# Patient Record
Sex: Female | Born: 1960 | Race: Black or African American | Hispanic: No | Marital: Married | Smoking: Never smoker
Health system: Southern US, Community
[De-identification: ages and names within clinical notes are randomized; demographics above are authoritative.]

## PROBLEM LIST (undated history)

## (undated) DIAGNOSIS — I10 Essential (primary) hypertension: Secondary | ICD-10-CM

## (undated) DIAGNOSIS — E119 Type 2 diabetes mellitus without complications: Secondary | ICD-10-CM

## (undated) HISTORY — PX: ABDOMINAL HYSTERECTOMY: SHX81

## (undated) HISTORY — PX: KNEE SURGERY: SHX244

## (undated) HISTORY — PX: FINGER SURGERY: SHX640

---

## 1999-03-19 ENCOUNTER — Other Ambulatory Visit: Admission: RE | Admit: 1999-03-19 | Discharge: 1999-03-19 | Payer: Self-pay | Admitting: Otolaryngology

## 2002-04-17 ENCOUNTER — Other Ambulatory Visit: Admission: RE | Admit: 2002-04-17 | Discharge: 2002-04-17 | Payer: Self-pay | Admitting: *Deleted

## 2005-08-17 ENCOUNTER — Ambulatory Visit: Payer: Self-pay | Admitting: General Practice

## 2006-08-31 ENCOUNTER — Ambulatory Visit: Payer: Self-pay | Admitting: General Practice

## 2007-07-01 ENCOUNTER — Emergency Department (HOSPITAL_COMMUNITY): Admission: EM | Admit: 2007-07-01 | Discharge: 2007-07-01 | Payer: Self-pay | Admitting: Emergency Medicine

## 2007-07-26 ENCOUNTER — Other Ambulatory Visit: Admission: RE | Admit: 2007-07-26 | Discharge: 2007-07-26 | Payer: Self-pay | Admitting: Obstetrics and Gynecology

## 2007-09-05 ENCOUNTER — Ambulatory Visit: Payer: Self-pay | Admitting: General Practice

## 2007-10-23 ENCOUNTER — Inpatient Hospital Stay (HOSPITAL_COMMUNITY): Admission: RE | Admit: 2007-10-23 | Discharge: 2007-10-26 | Payer: Self-pay | Admitting: Obstetrics and Gynecology

## 2007-10-23 ENCOUNTER — Encounter: Payer: Self-pay | Admitting: Obstetrics and Gynecology

## 2007-10-28 ENCOUNTER — Inpatient Hospital Stay (HOSPITAL_COMMUNITY): Admission: AD | Admit: 2007-10-28 | Discharge: 2007-10-30 | Payer: Self-pay | Admitting: Obstetrics and Gynecology

## 2008-08-04 ENCOUNTER — Emergency Department (HOSPITAL_COMMUNITY): Admission: EM | Admit: 2008-08-04 | Discharge: 2008-08-05 | Payer: Self-pay | Admitting: Emergency Medicine

## 2008-09-11 ENCOUNTER — Ambulatory Visit: Payer: Self-pay | Admitting: General Practice

## 2008-09-30 ENCOUNTER — Encounter: Admission: RE | Admit: 2008-09-30 | Discharge: 2008-09-30 | Payer: Self-pay | Admitting: Endocrinology

## 2008-11-30 IMAGING — MG MM DIGITAL SCREENING BILAT W/ CAD
1 series · 4 of 4 positions shown · non-contrast
Comparison: none

REASON FOR EXAM: scr mammo
COMMENTS:

PROCEDURE:     [facility] - [facility] SCRN MAMMO DIGITAL /CAD  - September 11, 2008  [DATE]
RESULT:       Comparison is made to prior examinations of 09/05/07, 08/31/06
and 08/17/05.
The breast parenchyma is heterogenously dense.  No dominant mass or
malignant-appearing calcifications are seen.

[R CC · right · 4 of 4 slices shown]
[im 1/4]
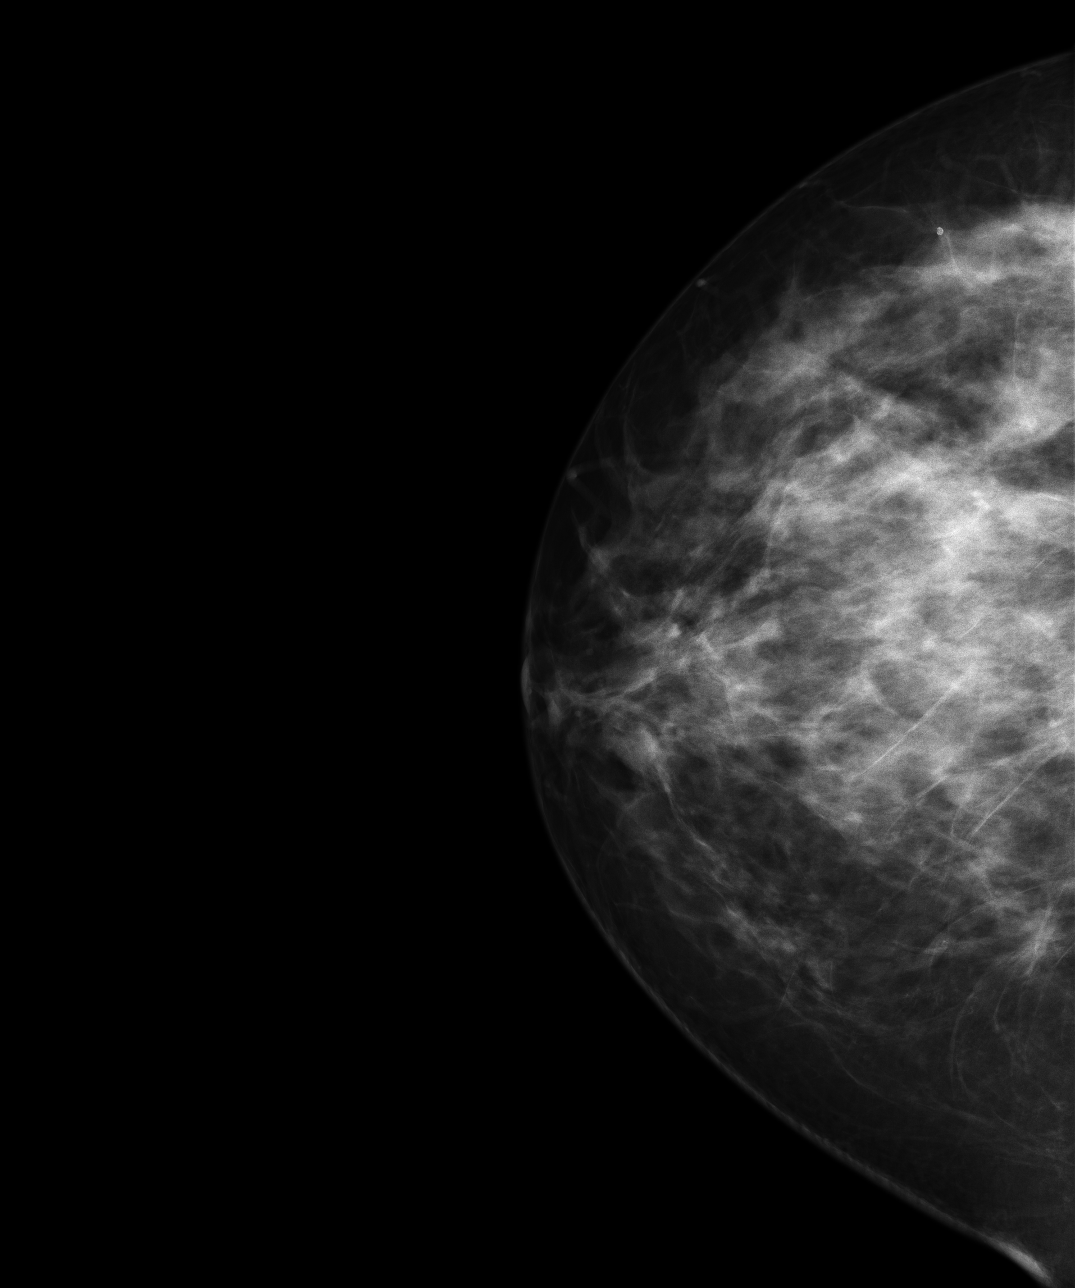
[im 2/4]
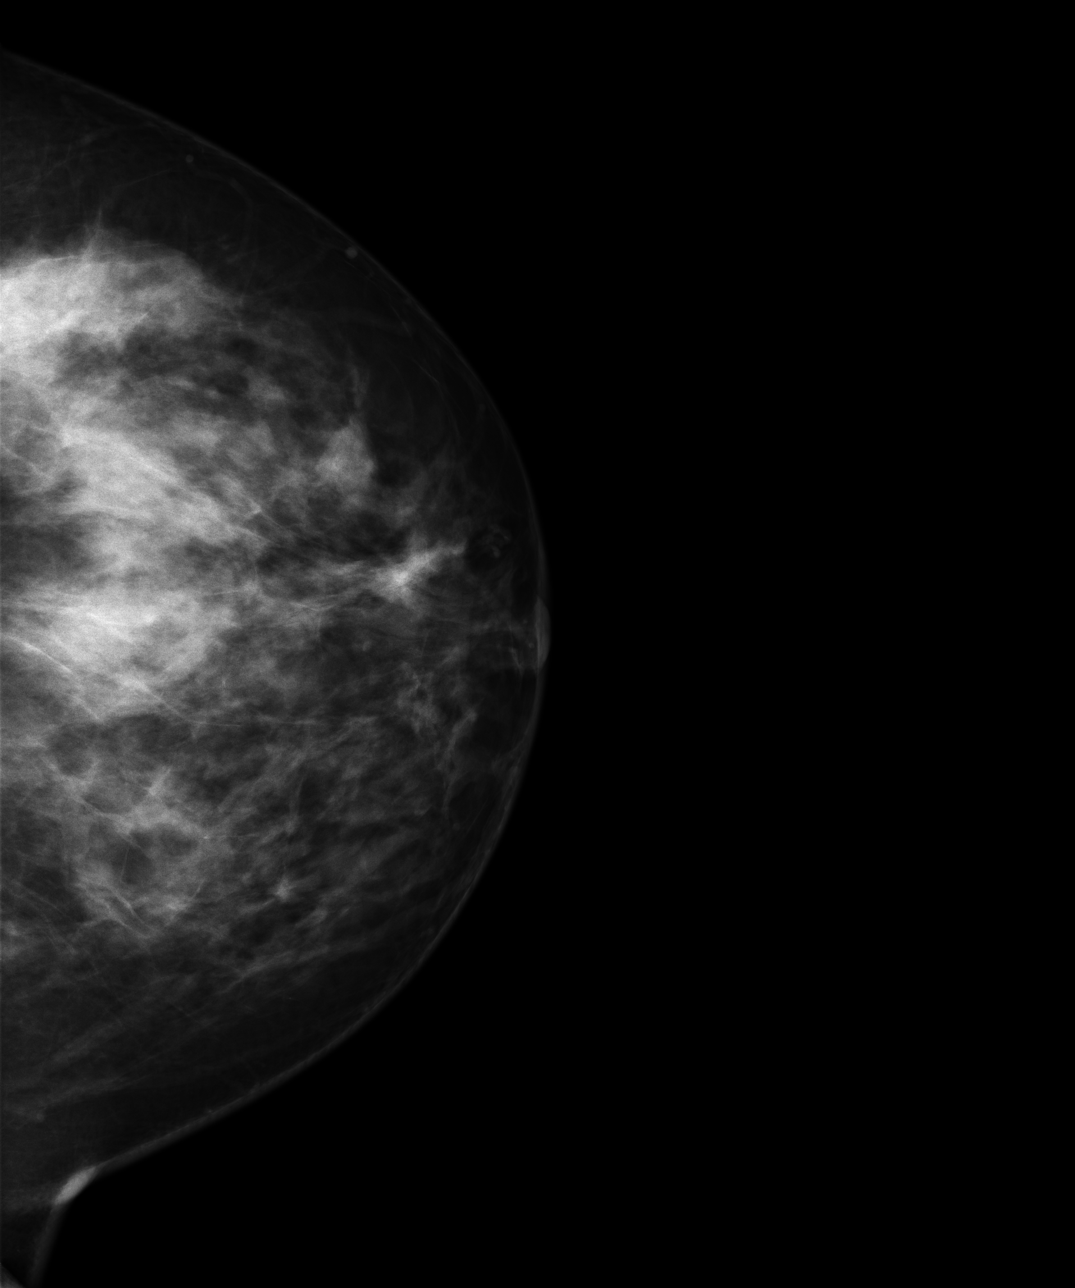
[im 3/4]
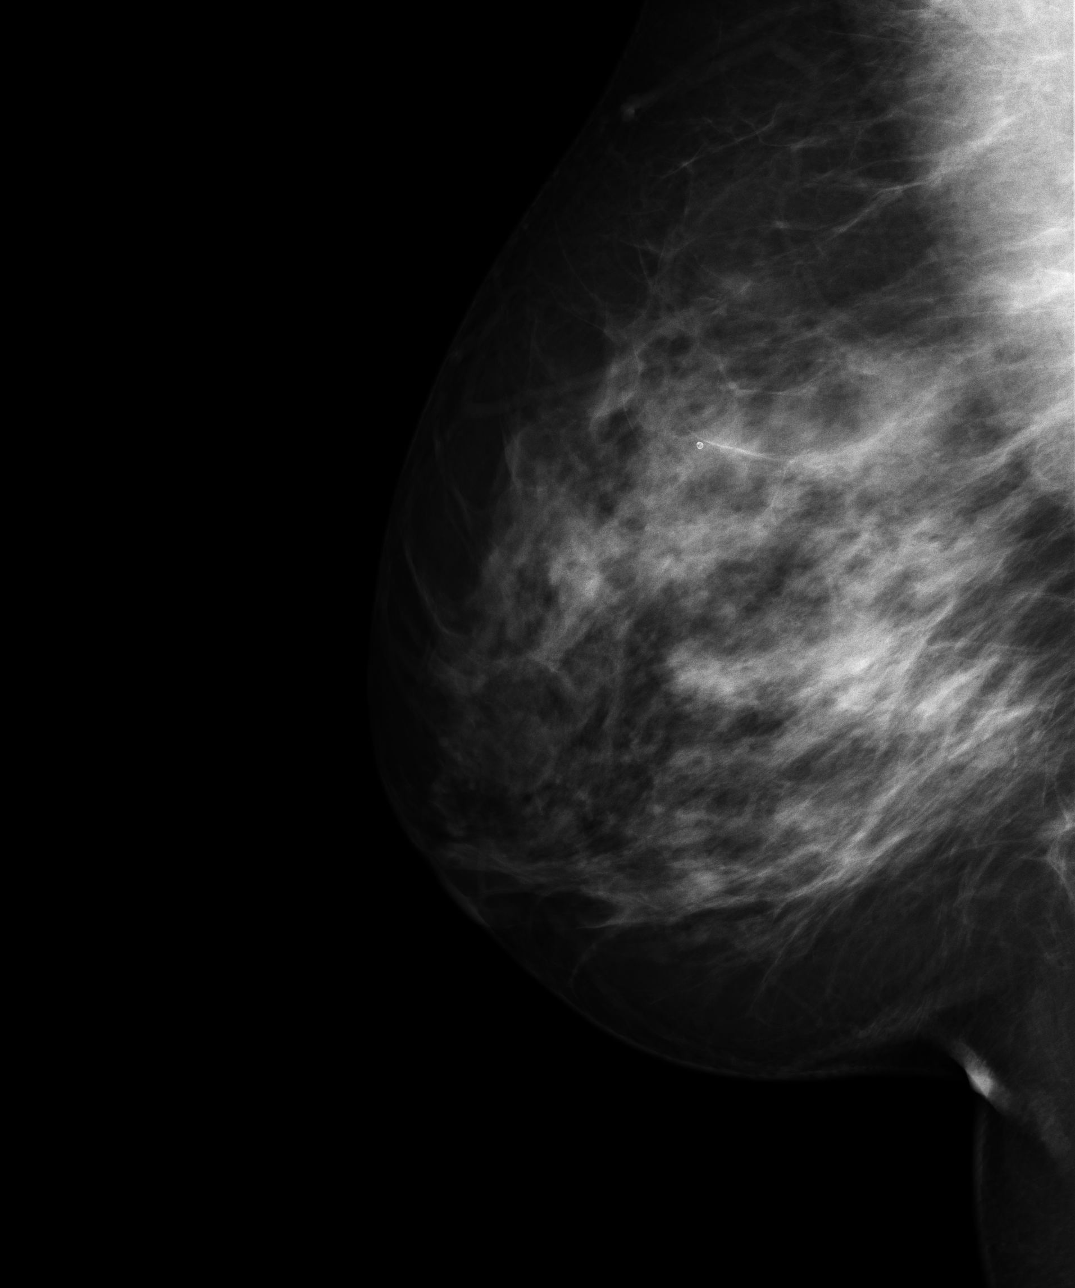
[im 4/4]
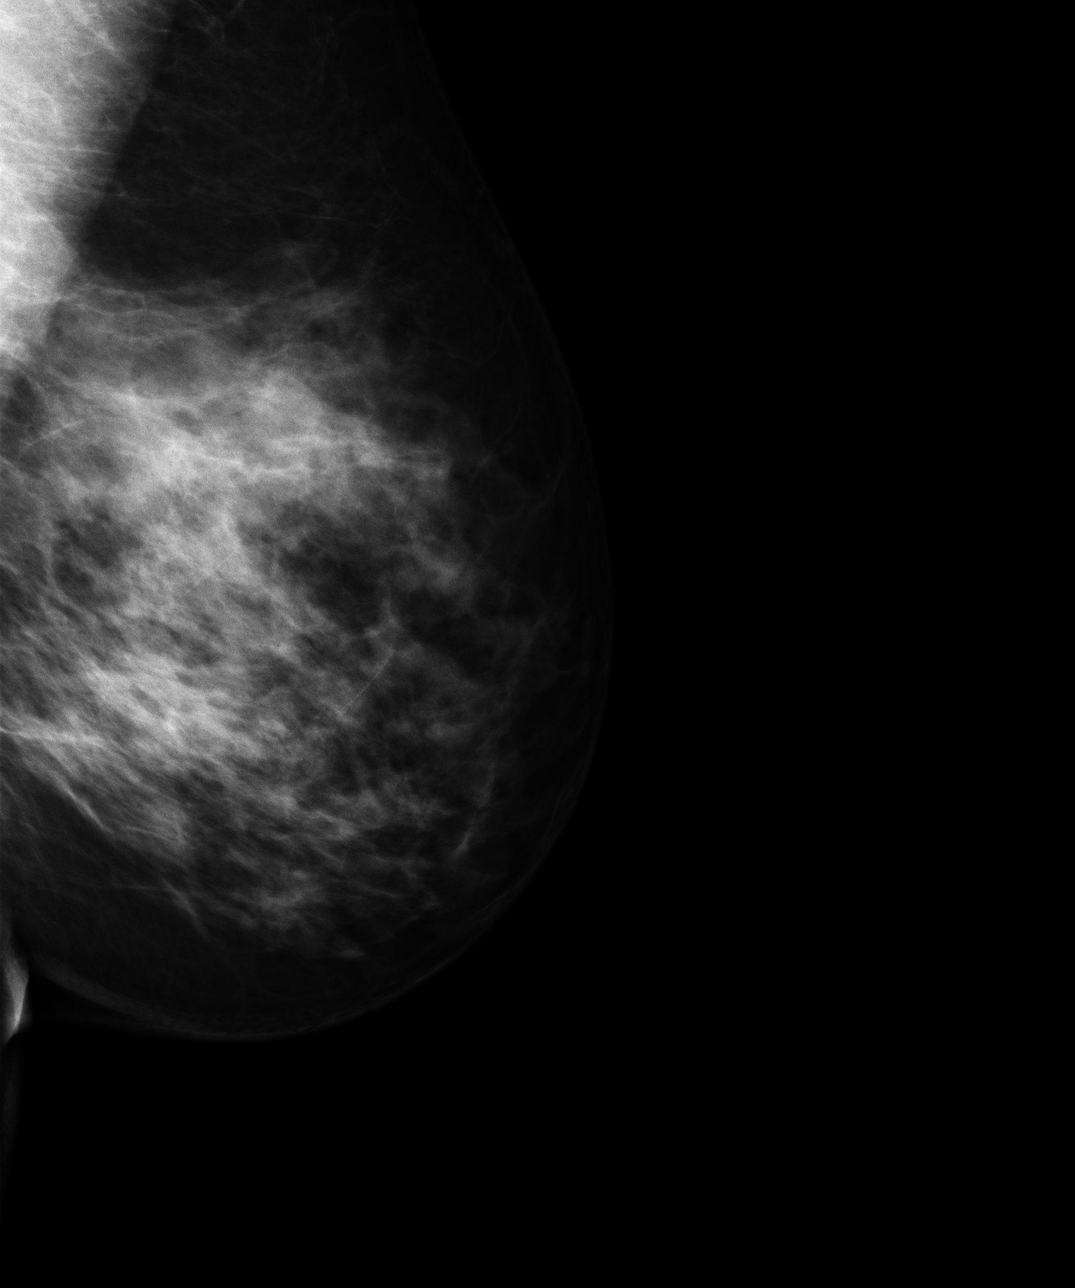

[4 of 4 positions shown; findings below may reference images not displayed]

IMPRESSION: 1.     Bilaterally benign-appearing screening mammography.
2.     Continued annual screening mammography is recommended.
3.     BI-RADS:  Category 1-Negative.

A NEGATIVE MAMMOGRAM REPORT DOES NOT PRECLUDE BIOPSY OR OTHER EVALUATION OF
A CLINICALLY PALPABLE OR OTHERWISE SUSPICIOUS MASS OR LESION.  BREAST CANCER
MAY NOT BE DETECTED BY MAMMOGRAPHY IN UP TO 10% OF CASES.

## 2009-02-10 ENCOUNTER — Encounter: Admission: RE | Admit: 2009-02-10 | Discharge: 2009-02-10 | Payer: Self-pay | Admitting: Otolaryngology

## 2009-04-10 ENCOUNTER — Emergency Department (HOSPITAL_COMMUNITY): Admission: EM | Admit: 2009-04-10 | Discharge: 2009-04-10 | Payer: Self-pay | Admitting: Emergency Medicine

## 2009-09-16 ENCOUNTER — Ambulatory Visit: Payer: Self-pay | Admitting: General Practice

## 2009-11-21 ENCOUNTER — Emergency Department (HOSPITAL_COMMUNITY): Admission: EM | Admit: 2009-11-21 | Discharge: 2009-11-21 | Payer: Self-pay | Admitting: Emergency Medicine

## 2010-09-08 ENCOUNTER — Ambulatory Visit: Payer: Self-pay | Admitting: General Practice

## 2011-03-30 NOTE — H&P (Signed)
Regina Rodriguez, DILLOW NO.:  [id number]   MEDICAL RECORD NO.:  [id number]          PATIENT TYPE:  INP   LOCATION:  9305                          FACILITY:  [facility]   PHYSICIAN:  Almedia Balls. Fore, M.D.   DATE OF BIRTH:  1961-08-11   DATE OF ADMISSION:  10/28/2007  DATE OF DISCHARGE:                              HISTORY & PHYSICAL   CHIEF COMPLAINT:  Incision open.   HISTORY:  Patient is a 50 year old who underwent TAH by Dr. Tresa Res on  October 23, 2007.  She was discharged on October 26, 2007 after having  some problem with GI complaints.  The hysterectomy was for fibroids and  excessive blood loss and anemia with menses.  She did well at home until  midday on October 28, 2007 when she noted that the small area of her  vertical incision had opened at the lower pole.  I spoke to her at  approximately 2:30 p.m. on December 13 at which time her description  noted that the incision was open as big as my pinky.  The incision  opened further over the evening, and the patient presented at M[facility]  Admission Unit at [facility] at approximately 2030 hours.  On  examination, the lower one-third of the incision was open when I  initially examined her.  I probed this after obtaining cultures with  sterile gloves and the incision opened spontaneously up to the lower two-  thirds of the incision as well.  There was no significant hemorrhage  from this area and no purulent discharge.  The patient is admitted at  this time for wound care and observation.   PAST MEDICAL HISTORY:  Two C-sections for her two pregnancies.  No other  surgeries.   She has taken only pain medications--oxycodone/APAP 5/325 for pain since  surgery.  She does not routinely take other medications and is allergic  to no other medications.   FAMILY HISTORY:  Includes mother with diabetes mellitus.   REVIEW OF SYSTEMS:  HEENT:  Negative.  CARDIORESPIRATORY:  Negative.  GASTROINTESTINAL:  Still with  decreased bowel function, but is passing  flatus.  No spontaneous bowel movement as of yet.  GENITOURINARY:  Essentially as in present illness.  NEUROMUSCULAR:  Negative.   PHYSICAL EXAMINATION:  VITAL SIGNS:  Blood pressure 118/67, pulse 85,  respirations 20, temperature 98.7.  GENERAL:  Well-developed black female in no acute distress.  HEENT:  Within normal limits.  NECK:  Supple without masses, adenopathy, or bruits.  HEART:  Regular rate and rhythm without murmurs.  LUNGS:  Clear to P&A.  BREAST EXAM:  Deferred.  ABDOMEN:  Has vertical lower abdominal incision in the midline which is  open for the lower two-thirds of this incision.  Bowel sounds are  decreased.  PELVIC EXAM:  Likewise deferred.  EXTREMITIES:  Within normal limits.  CENTRAL NERVOUS SYSTEM:  Grossly intact.  SKIN:  As noted above.   IMPRESSION:  1. Status post total abdominal hysterectomy.  2. Incision dehiscence.   DISPOSITION:  Admit for wound care and observation.  ______________________________  Almedia Balls Randell Patient, M.D.     SRF/MEDQ  D:  10/28/2007  T:  10/30/2007  Job:  [id number]

## 2011-03-30 NOTE — Op Note (Signed)
[name], [name]:  [id number]   MEDICAL RECORD NO.:  [id number]          PATIENT TYPE:  INP   LOCATION:  9303                          [facility]ACILITY:  [facility]   PHYSICIAN:  Cynthia P. Romine, M.D.DATE OF BIRTH:  01-04-1961   DATE OF PROCEDURE:  10/23/2007  DATE OF DISCHARGE:                               OPERATIVE REPORT   PREOPERATIVE DIAGNOSIS:  Fibroids and menorrhagia, probable adenomyosis.   POSTOPERATIVE DIAGNOSIS:  Fibroids and menorrhagia, probable  adenomyosis. Path pending.   PROCEDURE:  Total abdominal hysterectomy.   SURGEON:  Dr. Edwena Felty. Romine.,   ASSISTANT:  Dr. Leda Quail   ANESTHESIA:  General endotracheal.   ESTIMATED BLOOD LOSS:  300 mL.   COMPLICATIONS:  None.   PROCEDURE:  The patient is taken to the operating room and after the  induction of adequate general endotracheal anesthesia was prepped and  draped in the usual fashion and Foley catheter inserted.  A midline  vertical incision was made and carried down to the fascia with the  Bovie. There was quite a depth of subcutaneous tissue. The fascia was  opened with the Bovie and continued with the Mayo scissors. The rectus  muscle was separated sharply in the midline.  The underlying peritoneum  was elevated with hemostats and entered atraumatically.  The peritoneum  was opened vertically. The abdomen was explored.  The liver edge was  smooth.  The gallbladder contained no stones.  There was no pelvic or  periaortic adenopathy in the pelvis. The  uterus was enlarged and  nodular consistent with fibroids.  It was approximately 12-14 weeks  size.  The ovaries both appeared normal and were retained. There was no  other pelvic pathology noted. A deep Balfour retractor was placed. The  bladder was deep enough so that a standard bladder blade that went with  the deep Balfour retractor would not retract the bladder, therefore the  malleable retractor was used for a bladder  retractor. The uterus was  deep in the and the pelvis was very deep itself. Exposure was difficult.  It was necessary to extend the incision a little more inferiorly because  originally an attempt had been made to not place the incision in the  crease underneath the patient's panus however, for exposure it was  necessary to do so, so the incision was extended inferiorly. Retractors  were then replaced. The bowel was packed away although it was quite  copious and because the abdomen was so deep, it was difficult to  maintain the bowel away with the packing. The uterus was elevated out of  the pelvis, the round ligaments were identified sutured, and divided  with the Bovie. The anterior and posterior leaf of the broad ligament  were taken down sharply, a window was created in the mesosalpinx and the  pedicle containing the tube, the round, and the utero-ovarian ligament  was clamped, cut and doubly tied on each side.  The bladder was taken  down sharply, the uterine arteries were skeletonized, clamped and doubly  tied on each side. After the pedicle containing the ovarian arteries  was  clamped in or to continue down the broad ligament, it  was necessary to  use long needle holders, a long Bovie and long sutures because the  pelvis was so deep and exposure was difficult. The hysterectomy  continued down the broad ligament clamping, cutting and tying in  sequence. The bladder was maintained well away from the operative site.  The vagina was entered and the specimen was removed with Jorgenson  scissors.  Kochers were used to grasp the margins of the vagina.  Angled  sutures were placed at the right and left sides of the vagina and then  it was closed with interrupted figure-of-eight sutures of #0 Vicryl. The  pelvis was irrigated. There was an area of bleeding in the left vaginal  angle which was sutured with #0 Vicryl and hemostasis was achieved.  The  ovaries were tied up to the round  ligament. Exposure was quite difficult  as the whole time we were fighting the bowel and the subcutaneous fat  however, the pelvis was found to be hemostatic.  The packs were removed.  The bowel was allowed to return to its anatomic position, peritoneum was  closed with a running suture of #0 Vicryl. The fascia was closed with a  suture of #0 Vicryl going from each end towards the midline . Prior to  closure an On-Q subfascial catheter was placed and the fascia was  closed. The subcutaneous tissue was irrigated.  A subcutaneous JP drain  was placed through a stab wound in the left upper quadrant. The On-Q  catheter was brought out in the right upper quadrant. Hemostasis was  achieved in the subcu tissue with the Bovie.  The skin was closed with  staples and the procedure terminated.  The patient tolerated it well and  went in satisfactory condition to post anesthesia recovery.  Sponge,  needle and instrument counts were correct x3.      Cynthia P. Romine, M.D.  Electronically Signed     CPR/MEDQ  D:  10/23/2007  T:  10/23/2007  Job:  [id number]

## 2011-04-02 NOTE — Discharge Summary (Signed)
[name], Regina [name]:  [id number]   MEDICAL RECORD NO.:  [id number]          PATIENT TYPE:  INP   LOCATION:  9303                          [facility]   PHYSICIAN:  Cynthia P. Romine, M.D.DATE OF BIRTH:  [birth year]   DATE OF ADMISSION:  10/23/2007  DATE OF DISCHARGE:  10/26/2007                               DISCHARGE SUMMARY   DISCHARGE DIAGNOSES:  1. Menorrhagia with fibroids.  2. Anemia.   HISTORY:  This is a 50 year old married black female gravida 2, para 2  who is status post two C-sections who was experiencing menorrhagia,  anemia and has known uterine fibroids and she is admitted for total  abdominal hysterectomy.  On her preop exam, her uterus was felt to be  approximately 14-week size.  On October 23, 2007, she was admitted and  underwent a total abdominal hysterectomy.  Estimated blood loss was 300  mL.  The patient was 5 feet 5 and 200 pounds and a midline incision was  made, however, she had had a previous Pfannenstiel for her C-section.  Postoperatively, she did very well.  On her back second postoperative  day, she experienced some nausea which resolved by the next day so her  discharge was on October 26, 2007.  On her exam, as her staples were  removed, however, from her big midline incision, without the lower 6 cm  of the incision opened above the fascia.  It was cleaned with peroxide  and Dermabond and Steri-Strips were applied.  The patient was allowed to  ambulate and make sure the incision would not immediately reopen and it  did not.  Therefore, she was sent home and scheduled for recheck in the  office in a couple of days to recheck her incisions.  She was given full  discharge instructions regarding pelvic rest and follow-up in the office  her lab showed admission H&H was 12 and 37; on discharge, 10 and 31.      Cynthia P. Romine, M.D.  Electronically Signed     CPR/MEDQ  D:  12/26/2007  T:  12/27/2007  Job:  [id number]

## 2011-04-02 NOTE — Discharge Summary (Signed)
Regina Rodriguez, Regina Rodriguez               ACCOUNT NO.:  [id number]   MEDICAL RECORD NO.:  [id number]          PATIENT TYPE:  INP   LOCATION:  9315                          [facility]   PHYSICIAN:  Cynthia P. Romine, M.D.DATE OF BIRTH:  [birth year]   DATE OF ADMISSION:  10/28/2007  DATE OF DISCHARGE:  10/30/2007                               DISCHARGE SUMMARY   DISCHARGE DIAGNOSIS:  Dehiscence of incision status post abdominal  hysterectomy above the fascia.   HISTORY:  This is a 50 year old black female who is status post total  abdominal hysterectomy on October 23, 2007.  When the staples were  removed prior to discharge, her incision had opened at about the lower 6  cm of this vertical incision, and above the fascia it was closed with  Dermabond and Steri-Strips, and the patient called on December 13  stating that her incision had reopened.   HOSPITAL COURSE:  She was admitted for wound care and observation.  On  October 28, 2007, she was admitted, and she had wet-to-dry dressings  done b.i.d.  She had good pain relief.  She was able to be discharged  with home health care coming to see her on October 30, 2007.  She was  given full discharge instructions, and the arrangements were made for  home health care to come and do her dressing changes.  Labs showed her  admission H and H was 11 and 33.  Her white count was 6.2.  She did have  some wound cultures taken which showed moderate Staphylococcus aureus  and moderate E. coli.  She did not, however, require antibiotics.  Her  incision looks clean and healthy.      Cynthia P. Romine, M.D.  Electronically Signed     CPR/MEDQ  D:  12/26/2007  T:  12/27/2007  Job:  [id number]   cc:   Edwena Felty. Romine, M.D.  Fax: [phone]

## 2011-04-07 ENCOUNTER — Emergency Department (HOSPITAL_COMMUNITY)
Admission: EM | Admit: 2011-04-07 | Discharge: 2011-04-07 | Disposition: A | Discharge status: Home / Self Care | Payer: Federal, State, Local not specified - PPO | Attending: Emergency Medicine | Admitting: Emergency Medicine

## 2011-04-07 ENCOUNTER — Inpatient Hospital Stay (INDEPENDENT_AMBULATORY_CARE_PROVIDER_SITE_OTHER)
Admission: RE | Admit: 2011-04-07 | Discharge: 2011-04-07 | Disposition: A | Discharge status: Home / Self Care | Payer: Federal, State, Local not specified - PPO | Source: Ambulatory Visit | Attending: Emergency Medicine | Admitting: Emergency Medicine

## 2011-04-07 DIAGNOSIS — R609 Edema, unspecified: Secondary | ICD-10-CM

## 2011-04-07 DIAGNOSIS — M79609 Pain in unspecified limb: Secondary | ICD-10-CM | POA: Insufficient documentation

## 2011-04-07 DIAGNOSIS — R300 Dysuria: Secondary | ICD-10-CM | POA: Insufficient documentation

## 2011-04-07 DIAGNOSIS — I10 Essential (primary) hypertension: Secondary | ICD-10-CM | POA: Insufficient documentation

## 2011-04-07 LAB — POCT I-STAT, CHEM 8
Creatinine, Ser: 0.7 mg/dL (ref 0.4–1.2)
HCT: 44 % (ref 36.0–46.0)
Hemoglobin: 15 g/dL (ref 12.0–15.0)
Potassium: 3.6 mEq/L (ref 3.5–5.1)
Sodium: 137 mEq/L (ref 135–145)
TCO2: 24 mmol/L (ref 0–100)

## 2011-08-16 LAB — BASIC METABOLIC PANEL
BUN: 8
Creatinine, Ser: 0.62
GFR calc Af Amer: >60
GFR calc non Af Amer: >60
Sodium: 137

## 2011-08-16 LAB — CBC: RBC: 4.35

## 2011-08-16 LAB — DIFFERENTIAL
Basophils Absolute: 0.2 — ABNORMAL HIGH
Basophils Relative: 3 — ABNORMAL HIGH
Eosinophils Absolute: 0.1
Eosinophils Relative: 2
Lymphocytes Relative: 30
Lymphs Abs: 2.2
Monocytes Absolute: 0.5
Monocytes Relative: 7
Neutro Abs: 4.4
Neutrophils Relative %: 59

## 2011-08-16 LAB — POCT CARDIAC MARKERS
Myoglobin, poc: 26.8
Troponin i, poc: <0.05

## 2011-08-16 LAB — D-DIMER, QUANTITATIVE: D-Dimer, Quant: <0.22

## 2011-08-23 LAB — CBC
HCT: 31.1 — ABNORMAL LOW
Hemoglobin: 10.5 — ABNORMAL LOW
Hemoglobin: 12.6
MCHC: 34.1
Platelets: 223
Platelets: 262
RBC: 4.05
RBC: 4.66
RDW: 15.9 — ABNORMAL HIGH
RDW: 18.2 — ABNORMAL HIGH
WBC: 6.2

## 2011-08-23 LAB — BASIC METABOLIC PANEL
Calcium: 8.9
GFR calc Af Amer: >60
Glucose, Bld: 114 — ABNORMAL HIGH
Potassium: 3.8

## 2011-08-23 LAB — WOUND CULTURE

## 2011-08-27 LAB — URINALYSIS, ROUTINE W REFLEX MICROSCOPIC
Leukocytes, UA: NEGATIVE
Nitrite: NEGATIVE
Specific Gravity, Urine: 1.017

## 2011-08-27 LAB — DIFFERENTIAL
Basophils Absolute: 0.1
Monocytes Absolute: 0.4
Neutro Abs: 2.8
Neutrophils Relative %: 54

## 2011-08-27 LAB — COMPREHENSIVE METABOLIC PANEL
ALT: 16
AST: 18
Albumin: 3.4 — ABNORMAL LOW
Alkaline Phosphatase: 63
BUN: 11
CO2: 24
Calcium: 8.8
Chloride: 109
Creatinine, Ser: 0.6
Potassium: 4
Sodium: 138

## 2011-08-27 LAB — URINE MICROSCOPIC-ADD ON

## 2011-08-27 LAB — LIPASE, BLOOD: Lipase: 21

## 2011-08-27 LAB — CBC
HCT: 25.9 — ABNORMAL LOW
Platelets: 362

## 2011-09-07 ENCOUNTER — Ambulatory Visit: Payer: Self-pay

## 2012-09-05 ENCOUNTER — Ambulatory Visit: Payer: Self-pay

## 2013-09-12 ENCOUNTER — Ambulatory Visit: Payer: Self-pay | Admitting: General Practice

## 2013-12-21 ENCOUNTER — Ambulatory Visit (INDEPENDENT_AMBULATORY_CARE_PROVIDER_SITE_OTHER): Payer: Federal, State, Local not specified - PPO | Admitting: Emergency Medicine

## 2013-12-21 VITALS — BP 134/64 | HR 84 | Temp 98.3°F | Resp 15 | Ht 65.0 in | Wt 203.0 lb

## 2013-12-21 DIAGNOSIS — Z0000 Encounter for general adult medical examination without abnormal findings: Secondary | ICD-10-CM

## 2013-12-21 NOTE — Addendum Note (Signed)
Addended by: Carmelina DaneANDERSON, Paytin Ramakrishnan S on: 12/21/2013 05:15 PM   Modules accepted: Level of Service

## 2013-12-21 NOTE — Progress Notes (Signed)
[facility] [address], [place] [address] [phone]  Date:  12/21/2013   Name:  [name]   DOB:  05/22/1961   MRN:  [id number]  PCP:  Geraldo PitterBLAND,VEITA J, MD    Chief Complaint: Annual Exam   History of Present Illness:  [name] is a 53 y.o. very pleasant female patient who presents with the following:  Wellness exam and TB screening  There are no active problems to display for this patient.   History reviewed. No pertinent past medical history.  Past Surgical History  Procedure Laterality Date  . Abdominal hysterectomy      History  Substance Use Topics  . Smoking status: Never Smoker   . Smokeless tobacco: Not on file  . Alcohol Use: No    Family History  Problem Relation Age of Onset  . Heart disease Father     No Known Allergies  Medication list has been reviewed and updated.  No current outpatient prescriptions on file prior to visit.   No current facility-administered medications on file prior to visit.    Review of Systems:  As per HPI, otherwise negative.    Physical Examination: Filed Vitals:   12/21/13 1610  BP: 134/64  Pulse: 84  Temp: 98.3 F (36.8 C)  Resp: 15   Filed Vitals:   12/21/13 1610  Height: 5\' 5"  (1.651 m)  Weight: 126 lb (57.153 kg)   Body mass index is 20.97 kg/(m^2). Ideal Body Weight: Weight in (lb) to have BMI = 25: 149.9  GEN: WDWN, NAD, Non-toxic, A & O x 3 HEENT: Atraumatic, Normocephalic. Neck supple. No masses, No LAD. Ears and Nose: No external deformity. CV: RRR, No M/G/R. No JVD. No thrill. No extra heart sounds. PULM: CTA B, no wheezes, crackles, rhonchi. No retractions. No resp. distress. No accessory muscle use. ABD: S, NT, ND, +BS. No rebound. No HSM. EXTR: No c/c/e NEURO Normal gait.  PSYCH: Normally interactive. Conversant. Not depressed or anxious appearing.  Calm demeanor.    Assessment and Plan: Wellness exam   Signed,  Phillips OdorJeffery Sueann Brownley, MD

## 2014-03-15 ENCOUNTER — Other Ambulatory Visit: Payer: Self-pay | Admitting: Obstetrics and Gynecology

## 2014-04-12 ENCOUNTER — Other Ambulatory Visit: Payer: Self-pay | Admitting: Obstetrics and Gynecology

## 2015-04-16 ENCOUNTER — Other Ambulatory Visit: Payer: Self-pay | Admitting: Obstetrics and Gynecology

## 2015-04-16 DIAGNOSIS — N6459 Other signs and symptoms in breast: Secondary | ICD-10-CM

## 2015-04-16 DIAGNOSIS — N644 Mastodynia: Secondary | ICD-10-CM

## 2015-04-22 ENCOUNTER — Ambulatory Visit
Admission: RE | Admit: 2015-04-22 | Discharge: 2015-04-22 | Disposition: A | Discharge status: Home / Self Care | Payer: Federal, State, Local not specified - PPO | Source: Ambulatory Visit | Attending: Obstetrics and Gynecology | Admitting: Obstetrics and Gynecology

## 2015-04-22 DIAGNOSIS — N6459 Other signs and symptoms in breast: Secondary | ICD-10-CM

## 2015-04-22 DIAGNOSIS — N644 Mastodynia: Secondary | ICD-10-CM

## 2015-04-24 ENCOUNTER — Other Ambulatory Visit: Payer: Federal, State, Local not specified - PPO

## 2016-04-05 DIAGNOSIS — R7301 Impaired fasting glucose: Secondary | ICD-10-CM | POA: Diagnosis not present

## 2016-04-05 DIAGNOSIS — E782 Mixed hyperlipidemia: Secondary | ICD-10-CM | POA: Diagnosis not present

## 2016-04-05 DIAGNOSIS — M130 Polyarthritis, unspecified: Secondary | ICD-10-CM | POA: Diagnosis not present

## 2016-04-05 DIAGNOSIS — I10 Essential (primary) hypertension: Secondary | ICD-10-CM | POA: Diagnosis not present

## 2016-09-30 DIAGNOSIS — E782 Mixed hyperlipidemia: Secondary | ICD-10-CM | POA: Diagnosis not present

## 2016-09-30 DIAGNOSIS — I10 Essential (primary) hypertension: Secondary | ICD-10-CM | POA: Diagnosis not present

## 2016-09-30 DIAGNOSIS — R7301 Impaired fasting glucose: Secondary | ICD-10-CM | POA: Diagnosis not present

## 2016-10-01 DIAGNOSIS — Z23 Encounter for immunization: Secondary | ICD-10-CM | POA: Diagnosis not present

## 2016-10-01 DIAGNOSIS — R7301 Impaired fasting glucose: Secondary | ICD-10-CM | POA: Diagnosis not present

## 2016-10-01 DIAGNOSIS — E782 Mixed hyperlipidemia: Secondary | ICD-10-CM | POA: Diagnosis not present

## 2016-10-01 DIAGNOSIS — I10 Essential (primary) hypertension: Secondary | ICD-10-CM | POA: Diagnosis not present

## 2016-10-29 DIAGNOSIS — J069 Acute upper respiratory infection, unspecified: Secondary | ICD-10-CM | POA: Diagnosis not present

## 2017-02-10 ENCOUNTER — Other Ambulatory Visit: Payer: Self-pay | Admitting: Obstetrics and Gynecology

## 2017-02-10 DIAGNOSIS — Z01419 Encounter for gynecological examination (general) (routine) without abnormal findings: Secondary | ICD-10-CM | POA: Diagnosis not present

## 2017-02-10 DIAGNOSIS — Z1231 Encounter for screening mammogram for malignant neoplasm of breast: Secondary | ICD-10-CM | POA: Diagnosis not present

## 2017-02-10 DIAGNOSIS — Z124 Encounter for screening for malignant neoplasm of cervix: Secondary | ICD-10-CM | POA: Diagnosis not present

## 2017-02-10 DIAGNOSIS — Z6833 Body mass index (BMI) 33.0-33.9, adult: Secondary | ICD-10-CM | POA: Diagnosis not present

## 2017-02-15 LAB — CYTOLOGY - PAP

## 2017-02-17 DIAGNOSIS — J069 Acute upper respiratory infection, unspecified: Secondary | ICD-10-CM | POA: Diagnosis not present

## 2017-02-17 DIAGNOSIS — E782 Mixed hyperlipidemia: Secondary | ICD-10-CM | POA: Diagnosis not present

## 2017-02-17 DIAGNOSIS — R05 Cough: Secondary | ICD-10-CM | POA: Diagnosis not present

## 2017-02-17 DIAGNOSIS — I10 Essential (primary) hypertension: Secondary | ICD-10-CM | POA: Diagnosis not present

## 2017-02-18 DIAGNOSIS — E782 Mixed hyperlipidemia: Secondary | ICD-10-CM | POA: Diagnosis not present

## 2017-02-18 DIAGNOSIS — R7301 Impaired fasting glucose: Secondary | ICD-10-CM | POA: Diagnosis not present

## 2017-02-18 DIAGNOSIS — I10 Essential (primary) hypertension: Secondary | ICD-10-CM | POA: Diagnosis not present

## 2017-04-08 ENCOUNTER — Ambulatory Visit: Payer: Federal, State, Local not specified - PPO | Admitting: Registered"

## 2017-05-06 ENCOUNTER — Encounter: Payer: Self-pay | Admitting: Registered"

## 2017-05-06 ENCOUNTER — Encounter: Payer: Federal, State, Local not specified - PPO | Attending: Family Medicine | Admitting: Registered"

## 2017-05-06 DIAGNOSIS — Z713 Dietary counseling and surveillance: Secondary | ICD-10-CM | POA: Insufficient documentation

## 2017-05-06 DIAGNOSIS — R7303 Prediabetes: Secondary | ICD-10-CM | POA: Diagnosis not present

## 2017-05-06 NOTE — Patient Instructions (Addendum)
Consider cutting down on sweet tea and juice, especially at night Consider having fish 2-3x/week salmon, tuna Consider taking Nordic Naturals fish oil Aim to have a protein with meals and snacks

## 2017-05-06 NOTE — Progress Notes (Signed)
Medical Nutrition Therapy:  Appt start time: 0940 end time:  1040.  Assessment:  Primary concerns today: Pt states she is her to manage her pre-diabetes. Pt states she wants to cook healthy, but husband is somewhat resistant. Pt states she has tried to lose weight and has been successful in the past by cutting back on sweet tea and exercising more. Pt states she wants to avoid health problems by keeping her BG under control and is interested in checking her blood sugar even though her doctor has not asked her to do so.  Pt states her sleep is disturbed by need to urinate (thinks it is due to medication), but is able to get back to sleep and wakes refreshed.  Pt states she has routines that she likes to stick with for exercise and eating habits.   Preferred Learning Style:   Auditory  Visual  Hands on  Learning Readiness:   Contemplating  MEDICATIONS: reviewed. Pt states increased need to urinate with medications. Pt states she was started on metformin but her doctor switched to Jardiance 1 yr ago at 10 mg, this year increased to 25 mg. Pt states she doesn't not remember having GI issues with metformin.  DIETARY INTAKE:  Usual eating pattern includes 2-3 meals and 1-2 snacks per day. 3-4x week family eats out, often at [employer].  24-hr recall:  B ( AM): 5x week when working - PB cracker, Lipton green tea artificial sweetener (at work) Snk ( AM): PB crackers OR Saltine crackers L ( PM): rice or pasta and fried chicken OR salad OR bacon, tomato sandwich OR cababage Snk ( PM): none D ( PM): pasta, meat & vegetable Snk ( PM): none OR ice cream sometimes  Beverages: water, tea, sweet tea, juice, upset stomach will drink gingerale,   Usual physical activity: walking when she can usually 2x/week, moves a lot of work & once in [employer] uses fitness room at work, home exercise includes [employer] machine & up down stairs 2x/week  Estimated energy needs: 1600 calories 180 g  carbohydrates 120 g protein 44 g fat  Progress Towards Goal(s):  In progress.   Nutritional Diagnosis:  NI-5.8.4 Inconsistent carbohydrate intake As related to exess carbs at dinner and carb only snacks.  As evidenced by diet recall and recent A1c increase..    Intervention:  Nutrition education for managing blood glucose with diet and lifestyle changes. Described diabetes. Defined the role of glucose and insulin.  Identified type of diabetes and pathophysiology.  Described the relationship between diabetes and cardiovascular risk.  Described the role of different macronutrients on glucose.  Explained how carbohydrates affect blood glucose. Stated what foods contain the most carbohydrates.  Discussed heart healthy fats. Provided information on SMBG.  Teaching Method Utilized:  Visual Auditory  Handouts given during visit include:  Copies from Living Well with Diabetes  Carb sheet  A1c sheet  ReliOn glucose meter information   Barriers to learning/adherence to lifestyle change: none  Demonstrated degree of understanding via:  Teach Back   Monitoring/Evaluation:  Dietary intake, exercise, and body weight prn.

## 2017-06-23 DIAGNOSIS — R7301 Impaired fasting glucose: Secondary | ICD-10-CM | POA: Diagnosis not present

## 2017-06-23 DIAGNOSIS — I10 Essential (primary) hypertension: Secondary | ICD-10-CM | POA: Diagnosis not present

## 2017-06-23 DIAGNOSIS — E782 Mixed hyperlipidemia: Secondary | ICD-10-CM | POA: Diagnosis not present

## 2017-06-24 DIAGNOSIS — M130 Polyarthritis, unspecified: Secondary | ICD-10-CM | POA: Diagnosis not present

## 2017-06-24 DIAGNOSIS — E782 Mixed hyperlipidemia: Secondary | ICD-10-CM | POA: Diagnosis not present

## 2017-06-24 DIAGNOSIS — R7301 Impaired fasting glucose: Secondary | ICD-10-CM | POA: Diagnosis not present

## 2017-06-24 DIAGNOSIS — I10 Essential (primary) hypertension: Secondary | ICD-10-CM | POA: Diagnosis not present

## 2017-08-09 DIAGNOSIS — M25562 Pain in left knee: Secondary | ICD-10-CM | POA: Diagnosis not present

## 2017-08-18 DIAGNOSIS — M25562 Pain in left knee: Secondary | ICD-10-CM | POA: Diagnosis not present

## 2017-09-02 DIAGNOSIS — M25562 Pain in left knee: Secondary | ICD-10-CM | POA: Diagnosis not present

## 2017-09-05 DIAGNOSIS — M25562 Pain in left knee: Secondary | ICD-10-CM | POA: Diagnosis not present

## 2017-09-13 ENCOUNTER — Encounter (HOSPITAL_COMMUNITY): Payer: Self-pay | Admitting: Emergency Medicine

## 2017-09-13 ENCOUNTER — Emergency Department (HOSPITAL_COMMUNITY): Payer: Federal, State, Local not specified - PPO

## 2017-09-13 ENCOUNTER — Ambulatory Visit: Payer: Self-pay | Admitting: Registered Nurse

## 2017-09-13 ENCOUNTER — Emergency Department (HOSPITAL_COMMUNITY)
Admission: EM | Admit: 2017-09-13 | Discharge: 2017-09-13 | Disposition: A | Discharge status: Home / Self Care | Payer: Federal, State, Local not specified - PPO | Attending: Emergency Medicine | Admitting: Emergency Medicine

## 2017-09-13 VITALS — BP 148/84 | HR 65 | Temp 97.1°F

## 2017-09-13 DIAGNOSIS — Z7982 Long term (current) use of aspirin: Secondary | ICD-10-CM | POA: Diagnosis not present

## 2017-09-13 DIAGNOSIS — S29012A Strain of muscle and tendon of back wall of thorax, initial encounter: Secondary | ICD-10-CM

## 2017-09-13 DIAGNOSIS — R079 Chest pain, unspecified: Secondary | ICD-10-CM | POA: Diagnosis not present

## 2017-09-13 DIAGNOSIS — Z79899 Other long term (current) drug therapy: Secondary | ICD-10-CM | POA: Diagnosis not present

## 2017-09-13 DIAGNOSIS — R0789 Other chest pain: Secondary | ICD-10-CM | POA: Insufficient documentation

## 2017-09-13 LAB — BASIC METABOLIC PANEL
Anion gap: 9 (ref 5–15)
BUN: 12 mg/dL (ref 6–20)
CHLORIDE: 102 mmol/L (ref 101–111)
CO2: 26 mmol/L (ref 22–32)
Calcium: 9.5 mg/dL (ref 8.9–10.3)
Creatinine, Ser: 0.83 mg/dL (ref 0.44–1.00)
GFR calc Af Amer: >60 mL/min (ref >60)
GFR calc non Af Amer: >60 mL/min (ref >60)
Glucose, Bld: 101 mg/dL — ABNORMAL HIGH (ref 65–99)
Potassium: 3.5 mmol/L (ref 3.5–5.1)
SODIUM: 137 mmol/L (ref 135–145)

## 2017-09-13 LAB — CBC
HCT: 38.4 % (ref 36.0–46.0)
Hemoglobin: 12.8 g/dL (ref 12.0–15.0)
MCH: 28.4 pg (ref 26.0–34.0)
MCHC: 33.3 g/dL (ref 30.0–36.0)
MCV: 85.3 fL (ref 78.0–100.0)
PLATELETS: 292 10*3/uL (ref 150–400)
RBC: 4.5 MIL/uL (ref 3.87–5.11)
RDW: 14.3 % (ref 11.5–15.5)
WBC: 5.9 10*3/uL (ref 4.0–10.5)

## 2017-09-13 LAB — I-STAT TROPONIN, ED: Troponin i, poc: 0 ng/mL (ref 0.00–0.08)

## 2017-09-13 NOTE — Progress Notes (Signed)
Subjective:    Patient ID: Regina Rodriguez, female    DOB: 09-08-1961, 56 y.o.   MRN: [id number]  56y/o african american female Pt reports intermittent L sided anterior  chest pain since yesterday after she initially had some scapular pain that resolved. Started gradually, progressively worsening. Intermittent. Sharp, stabbing pain to L chest above breast and radiating down lateral aspect L breast to axilla. Denies n/v, diaphoresis, SOB. Pain comes about every 5 min, lasting 1-2 minutes. Lifting arm upwards or out does make pain worse but does not actually cause the pain to start or initiate pain.  Felt like her left hand was a little weak yesterday. Denies any Hx chest pain, cardiac issues. Took Vicodin yesterday for knee pain, aspirin later in the afternoon then Ibuprofen this morning.  Denied recent rash/illness/heartburn.  Has surgery left knee pending for next week.  Works in [employer], political party denied any heavy lifting/new exertion/tasks out of her usual.  Denied trauma.       Review of Systems  Constitutional: Negative for activity change, appetite change, chills, diaphoresis, fatigue, fever and unexpected weight change.  HENT: Negative for congestion, rhinorrhea, sinus pain, sinus pressure, sneezing, sore throat, trouble swallowing and voice change.   Eyes: Negative for photophobia, discharge and visual disturbance.  Respiratory: Negative for cough, choking, chest tightness, shortness of breath, wheezing and stridor.   Cardiovascular: Positive for chest pain. Negative for palpitations and leg swelling.  Gastrointestinal: Negative for abdominal pain, diarrhea, nausea and vomiting.  Endocrine: Negative for cold intolerance and heat intolerance.  Genitourinary: Negative for difficulty urinating and dysuria.  Musculoskeletal: Positive for arthralgias and gait problem. Negative for back pain, joint swelling, myalgias, neck pain and neck stiffness.  Skin: Negative for color change, pallor, rash and  wound.  Allergic/Immunologic: Negative for environmental allergies and food allergies.  Neurological: Positive for weakness and headaches. Negative for dizziness, tremors, seizures, syncope, facial asymmetry, speech difficulty, light-headedness and numbness.  Psychiatric/Behavioral: Negative for agitation, confusion and sleep disturbance.       Objective:   Physical Exam  Constitutional: She is oriented to person, place, and time. Vital signs are normal. She appears well-developed and well-nourished. She is active and cooperative.  Non-toxic appearance. She does not have a sickly appearance. She does not appear ill. No distress.  HENT:  Head: Normocephalic and atraumatic.  Right Ear: Hearing, external ear and ear canal normal. A middle ear effusion is present.  Left Ear: Hearing, external ear and ear canal normal. A middle ear effusion is present.  Nose: Nose normal. No mucosal edema, rhinorrhea, nose lacerations, sinus tenderness, nasal deformity, septal deviation or nasal septal hematoma. No epistaxis.  No foreign bodies. Right sinus exhibits no maxillary sinus tenderness and no frontal sinus tenderness. Left sinus exhibits no maxillary sinus tenderness and no frontal sinus tenderness.  Mouth/Throat: Uvula is midline and mucous membranes are normal. Mucous membranes are not pale, not dry and not cyanotic. She does not have dentures. No oral lesions. No trismus in the jaw. Normal dentition. No dental abscesses, uvula swelling, lacerations or dental caries. No oropharyngeal exudate, posterior oropharyngeal edema, posterior oropharyngeal erythema or tonsillar abscesses.  Bilateral TMs air fluid level clear  Eyes: Pupils are equal, round, and reactive to light. Conjunctivae, EOM and lids are normal. Right eye exhibits no chemosis, no discharge, no exudate and no hordeolum. No foreign body present in the right eye. Left eye exhibits no chemosis, no discharge, no exudate and no hordeolum. No foreign  body present in  the left eye. Right conjunctiva is not injected. Right conjunctiva has no hemorrhage. Left conjunctiva is not injected. Left conjunctiva has no hemorrhage. No scleral icterus. Right eye exhibits normal extraocular motion and no nystagmus. Left eye exhibits normal extraocular motion and no nystagmus. Right pupil is round and reactive. Left pupil is round and reactive. Pupils are equal.  Neck: Trachea normal, normal range of motion and phonation normal. Neck supple. No tracheal tenderness, no spinous process tenderness and no muscular tenderness present. No neck rigidity. No tracheal deviation, no edema, no erythema and normal range of motion present. No thyroid mass and no thyromegaly present.  Cardiovascular: Normal rate, regular rhythm, S1 normal, S2 normal, normal heart sounds and intact distal pulses.  PMI is not displaced.  Exam reveals no gallop, no distant heart sounds and no friction rub.   No murmur heard. Pulses:      Radial pulses are 2+ on the right side, and 2+ on the left side.  Pulmonary/Chest: Effort normal and breath sounds normal. No accessory muscle usage or stridor. No respiratory distress. She has no decreased breath sounds. She has no wheezes. She has no rhonchi. She has no rales. Chest wall is not dull to percussion. She exhibits tenderness. She exhibits no mass, no bony tenderness, no laceration, no crepitus, no edema, no deformity, no swelling and no retraction.    Abdominal: Soft. Normal appearance and bowel sounds are normal. She exhibits no shifting dullness, no distension, no pulsatile liver, no fluid wave, no abdominal bruit, no ascites, no pulsatile midline mass and no mass. There is no hepatosplenomegaly. There is no tenderness. There is no rigidity, no rebound, no guarding, no tenderness at McBurney's point and negative Murphy's sign. Hernia confirmed negative in the ventral area.  Dull to percussion x 4 quads, not TTP; normoactive bowel sounds x 4 quads   Musculoskeletal: Normal range of motion. She exhibits tenderness. She exhibits no edema or deformity.       Right shoulder: Normal.       Left shoulder: Normal.       Right elbow: Normal.      Left elbow: Normal.       Right hip: Normal.       Left hip: Normal.       Right knee: Normal.       Left knee: Normal.       Cervical back: Normal.       Thoracic back: Normal.       Lumbar back: Normal.       Right hand: Normal.       Left hand: Normal.  Left latismus dorsii TTP and pain with elevating left hand above waist/shoulder/head level  Lymphadenopathy:       Head (right side): No submental, no submandibular, no tonsillar, no preauricular, no posterior auricular and no occipital adenopathy present.       Head (left side): No submental, no submandibular, no tonsillar, no preauricular, no posterior auricular and no occipital adenopathy present.    She has no cervical adenopathy.       Right cervical: No superficial cervical, no deep cervical and no posterior cervical adenopathy present.      Left cervical: No superficial cervical, no deep cervical and no posterior cervical adenopathy present.  Neurological: She is alert and oriented to person, place, and time. She has normal strength. She is not disoriented. She displays no atrophy, no tremor and normal reflexes. No cranial nerve deficit or sensory deficit. She exhibits normal  muscle tone. She displays no seizure activity. Coordination and gait normal. GCS eye subscore is 4. GCS verbal subscore is 5. GCS motor subscore is 6.  Reflex Scores:      Patellar reflexes are 1+ on the right side and 1+ on the left side. Bilateral hand grasp equal 5/5; upper and lower extremity strength equal 5/5; on/off exam table; in/out of chair and gait sure and steady in hall with and without knee support left  Skin: Skin is warm, dry and intact. No abrasion, no bruising, no burn, no ecchymosis, no laceration, no lesion, no petechiae and no rash noted. She is not  diaphoretic. No cyanosis or erythema. No pallor. Nails show no clubbing.  Psychiatric: She has a normal mood and affect. Her speech is normal and behavior is normal. Judgment and thought content normal. Cognition and memory are normal.  Nursing note and vitals reviewed.     1115 Patient returned 1 hour after taking her blood pressure medication and a tums. Here for repeat BP, improved 148/84 She still has had continued intermittent left anterior chest pain resolves with massaging left pectoral/latissus dorsii typically 5 minutes between episodes.  Denied SOB, dyspnea, wheezing, weakness, visual changes, heartburn, cough, chest tightness, sweating.  Patient going to eat lunch but if pain not improving or worsening will go to facility that can perform EKG.  Discussed with patient typically massage will not improve chest pain if heart attack that is usually musculoskeletal pain.  Reiterated when taking pain medication to take with snack or meal to decrease risk of gastritis.  Patient wearing left knee brace now also.  Patient verbalized understanding information/instructions, agreed with plan of care and had no further questions at this time.    Assessment & Plan:  A-chest pain, strain latismus dorsi left  P-Exitcare given on chest pain, gastritis and muscle strain to patient.  Go back to desk and take her usual blood pressure medications and follow up in 1 hour for repeat assessment/BP.  Discussed her chronic medications can be taken together at one time if she desires.  Would recommend taking any pain medications with food as taking on empty stomach could cause gastritis/ulcer if taking longer term.  Trial tums 2 po x 1 now; given 2 UD from clinic stock.  patient to monitor symptoms and follow up with PCM if worsening. Go to ER if clinic/PCM closed this weekend if worsening chest pain, dizziness, SOB, sweating, worst headache of life, radiation of pain to jaw and down left arm, feeling like elephant on  chest.  DIscussed with patient EKG not available in our clinic to check electrical functioning of her heart. Patient verbalized agreement and understanding of treatment plan and had no further questions at this time.   Patient was instructed to rest, ice, and ROM exercises. Activity as tolerated. Patient is to take OTC po NSAIDS as needed. Follow up if symptoms persist or worsen e.g. Arm weakness, swelling. Patient verbalized agreement and understanding of treatment plan. P2: [employer].

## 2017-09-13 NOTE — ED Provider Notes (Signed)
[facility]-EMERGENCY DEPT Provider Note   CSN: [id number] Arrival date & time: 09/13/17  1501     History   Chief Complaint Chief Complaint  Patient presents with  . Chest Pain    HPI Regina VANDERWEELE is a 56 y.o. female with no significant past medical history who presents to the emergency department today for left-sided chest pain since yesterday.  She states that her pain initially started as she was driving describes as a sharp, stabbing pain to the left chest above the breast and radiating to the left axilla.  She denies any nausea, vomiting, diaphoresis, shortness of breath, dyspnea on exertion associated with this.  The pain is nonexertional and non-positional.  She states that this pain comes on every 5-8 minutes and last for a few minutes before being relieved.  The pain is always occurred while the patient is at rest.  She does note that lifting the arm upwards or out does make the pain worse but does not actually cause the pain to start or initiate the pain.  Patient denies any history of chest pain or cardiac evaluation.  She has never had an echocardiogram or cardiac catheterization.  She denies any family history of early cardiac death.  She is a never smoker.  She denies any cocaine use.  No recent increase or binges of alcohol.  She denies any increase in physical activity. Denies risk factors for DVT/PE including exogenous estrogen use, recent surgery or travel, trauma, immobilization, smoking, previous blood clot, cough, hemoptysis, cancer, lower extremity pain or swelling, or family history of bleeding/clotting disorder.   Per chart review the [facility] and diagnosed with a muscle strain and atypical chest pain.  HPI  History reviewed. No pertinent past medical history.  There are no active problems to display for this patient.   Past Surgical History:  Procedure Laterality Date  . ABDOMINAL HYSTERECTOMY       OB History    No data available       Home Medications    Prior to Admission medications   Medication Sig Start Date End Date Taking? Authorizing Provider  aspirin EC 81 MG tablet Take 81 mg by mouth daily.   Yes [provider]  empagliflozin (JARDIANCE) 25 MG TABS tablet Take 25 mg by mouth daily.   Yes [provider]  HYDROcodone-acetaminophen (NORCO/VICODIN) 5-325 MG tablet take 1 tablet by mouth every 4 to 6 hours if needed for pain 09/05/17  Yes [provider]  valsartan-hydrochlorothiazide (DIOVAN-HCT) 80-12.5 MG per tablet Take 1 tablet by mouth daily.   Yes [provider]    Family History Family History  Problem Relation Age of Onset  . Heart disease Father     Social History Social History  Substance Use Topics  . Smoking status: Never Smoker  . Smokeless tobacco: Not on file  . Alcohol use No     Allergies   Patient has no known allergies.   Review of Systems Review of Systems  All other systems reviewed and are negative.    Physical Exam Updated Vital Signs BP 126/68   Pulse 86   Temp 98.3 F (36.8 C) (Oral)   Resp 18   Ht 5\' 7"  (1.702 m)   Wt 93.4 kg (206 lb)   SpO2 100%   BMI 32.26 kg/m   Physical Exam  Constitutional: She appears well-developed and well-nourished.  HENT:  Head: Normocephalic and atraumatic.  Right  Ear: External ear normal.  Left Ear: External ear normal.  Nose: Nose normal.  Mouth/Throat: Uvula is midline, oropharynx is clear and moist and mucous membranes are normal. No tonsillar exudate.  Eyes: Pupils are equal, round, and reactive to light. Right eye exhibits no discharge. Left eye exhibits no discharge. No scleral icterus.  Neck: Trachea normal and normal range of motion. Neck supple. No JVD present. No spinous process tenderness present. Carotid bruit is not present. No neck rigidity. Normal range of motion present.  Cardiovascular: Normal rate, regular rhythm and intact  distal pulses.   No murmur heard. Pulses:      Radial pulses are 2+ on the right side, and 2+ on the left side.       Dorsalis pedis pulses are 2+ on the right side, and 2+ on the left side.       Posterior tibial pulses are 2+ on the right side, and 2+ on the left side.  No lower extremity swelling or edema. Calves symmetric in size bilaterally.  Pulmonary/Chest: Effort normal and breath sounds normal. She exhibits tenderness.    Abdominal: Soft. Bowel sounds are normal. There is no tenderness. There is no rebound and no guarding.  Musculoskeletal: She exhibits no edema.       Left shoulder: She exhibits tenderness. She exhibits normal range of motion.  Lymphadenopathy:    She has no cervical adenopathy.  Neurological: She is alert.  Skin: Skin is warm and dry. No rash noted. She is not diaphoretic.  Psychiatric: She has a normal mood and affect.  Nursing note and vitals reviewed.    ED Treatments / Results  Labs (all labs ordered are listed, but only abnormal results are displayed) Labs Reviewed  BASIC METABOLIC PANEL - Abnormal; Notable for the following:       Result Value   Glucose, Bld 101 (*)    All other components within normal limits  CBC  I-STAT TROPONIN, ED    EKG  EKG Interpretation None       Radiology Dg Chest 2 View  Result Date: 09/13/2017 CLINICAL DATA:  Chest pain EXAM: CHEST  2 VIEW COMPARISON:  November 21, 2009 FINDINGS: Lungs are clear. Heart size and pulmonary vascularity are normal. No adenopathy. No pneumothorax. No bone lesions. IMPRESSION: No edema or consolidation. Electronically Signed   By: Bretta Bang III M.D.   On: 09/13/2017 16:13    Procedures Procedures (including critical care time)  Medications Ordered in ED Medications - No data to display   Initial Impression / Assessment and Plan / ED Course  I have reviewed the triage vital signs and the nursing notes.  Pertinent labs & imaging results that were available during  my care of the patient were reviewed by me and considered in my medical decision making (see chart for details).      Patient with chest pain.  Her vital signs are reassuring.  Exam does show that the patient has some chest tenderness. Chest pain is not likely of cardiac or pulmonary etiology d/t presentation, perc negative, VSS, no tracheal deviation, no JVD or new murmur, RRR, breath sounds equal bilaterally, EKG without acute abnormalities, negative troponin, and negative CXR. Patient refused second tn or further workup. We discussed the nature and purpose, risks and benefits, as well as, the alternatives of treatment. After refusal, I made every reasonable opportunity to treat them to the best of my ability.  The patient was notified that they may return to the emergency  department at any time for further treatment. Heart score 2. Pt has been advised start a PPI and take NSAIDS for pain. She is to  return to the ED is CP becomes exertional, associated with diaphoresis or nausea, radiates to left jaw/arm, worsens or becomes concerning in any way. Pt appears reliable for follow up.   Final Clinical Impressions(s) / ED Diagnoses   Final diagnoses:  Chest wall pain    New Prescriptions Discharge Medication List as of 09/13/2017  8:30 PM       Jacinto HalimMaczis, Lucretia Pendley M, PA-C 09/13/17 2242    Shaune PollackIsaacs, Cameron, MD 09/14/17 1141

## 2017-09-13 NOTE — Discharge Instructions (Signed)
Read instructions below for reasons to return to the Emergency Department. It is recommended that your follow up with your Primary Care Doctor in regards to today's visit. If you do not have a doctor, use the resource guide listed below to help you find one. Begin taking over the counter Prilosec or Zegrid (omeprazole) as directed  Tests performed today include: An EKG of your heart A chest x-ray Cardiac enzymes - a blood test for heart muscle damage Blood counts and electrolyte Vital signs. See below for your results today.   Chest Pain (Nonspecific)  HOME CARE INSTRUCTIONS  For the next few days, avoid physical activities that bring on chest pain. Continue physical activities as directed.  Do not smoke cigarettes or drink alcohol until your symptoms are gone. If you do smoke, it is time to quit. You may receive instructions and counseling on how to stop smoking. Only take over-the-counter or prescription medicine for pain, discomfort, or fever as directed by your caregiver.  Follow your caregiver's suggestions for further testing if your chest pain does not go away.  Keep any follow-up appointments you made. If you do not go to an appointment, you could develop lasting (chronic) problems with pain. If there is any problem keeping an appointment, you must call to reschedule.  SEEK MEDICAL CARE IF:  You think you are having problems from the medicine you are taking. Read your medicine instructions carefully.  Your chest pain does not go away, even after treatment.  You develop a rash with blisters on your chest.  SEEK IMMEDIATE MEDICAL CARE IF:  You have increased chest pain or pain that spreads to your arm, neck, jaw, back, or belly (abdomen).  You develop shortness of breath, an increasing cough, or you are coughing up blood.  You have severe back or abdominal pain, feel sick to your stomach (nauseous) or throw up (vomit).  You develop severe weakness, fainting, or chills.  You have an oral  temperature above 102 F (38.9 C), not controlled by medicine.  THIS IS AN EMERGENCY. Do not wait to see if the pain will go away. Get medical help at once. Call your local emergency services (911 in [place] Do not drive yourself to the hospital. Additional Information:  Your vital signs today were: BP 126/68    Pulse 86    Temp 98.3 F (36.8 C) (Oral)    Resp 18    Ht 5\' 7"  (1.702 m)    Wt 93.4 kg (206 lb)    SpO2 100%    BMI 32.26 kg/m  If your blood pressure (BP) was elevated above 135/85 this visit, please have this repeated by your doctor within one month. ---------------

## 2017-09-13 NOTE — Patient Instructions (Signed)
Nonspecific Chest Pain Chest pain can be caused by many different conditions. There is always a chance that your pain could be related to something serious, such as a heart attack or a blood clot in your lungs. Chest pain can also be caused by conditions that are not life-threatening. If you have chest pain, it is very important to follow up with your health care provider. What are the causes? Causes of this condition include:  Heartburn.  Pneumonia or bronchitis.  Anxiety or stress.  Inflammation around your heart (pericarditis) or lung (pleuritis or pleurisy).  A blood clot in your lung.  A collapsed lung (pneumothorax). This can develop suddenly on its own (spontaneous pneumothorax) or from trauma to the chest.  Shingles infection (varicella-zoster virus).  Heart attack.  Damage to the bones, muscles, and cartilage that make up your chest wall. This can include: ? Bruised bones due to injury. ? Strained muscles or cartilage due to frequent or repeated coughing or overwork. ? Fracture to one or more ribs. ? Sore cartilage due to inflammation (costochondritis).  What increases the risk? Risk factors for this condition may include:  Activities that increase your risk for trauma or injury to your chest.  Respiratory infections or conditions that cause frequent coughing.  Medical conditions or overeating that can cause heartburn.  Heart disease or family history of heart disease.  Conditions or health behaviors that increase your risk of developing a blood clot.  Having had chicken pox (varicella zoster).  What are the signs or symptoms? Chest pain can feel like:  Burning or tingling on the surface of your chest or deep in your chest.  Crushing, pressure, aching, or squeezing pain.  Dull or sharp pain that is worse when you move, cough, or take a deep breath.  Pain that is also felt in your back, neck, shoulder, or arm, or pain that spreads to any of these  areas.  Your chest pain may come and go, or it may stay constant. How is this diagnosed? Lab tests or other studies may be needed to find the cause of your pain. Your health care provider may have you take a test called an ECG (electrocardiogram). An ECG records your heartbeat patterns at the time the test is performed. You may also have other tests, such as:  Transthoracic echocardiogram (TTE). In this test, sound waves are used to create a picture of the heart structures and to look at how blood flows through your heart.  Transesophageal echocardiogram (TEE).This is a more advanced imaging test that takes images from inside your body. It allows your health care provider to see your heart in finer detail.  Cardiac monitoring. This allows your health care provider to monitor your heart rate and rhythm in real time.  Holter monitor. This is a portable device that records your heartbeat and can help to diagnose abnormal heartbeats. It allows your health care provider to track your heart activity for several days, if needed.  Stress tests. These can be done through exercise or by taking medicine that makes your heart beat more quickly.  Blood tests.  Other imaging tests.  How is this treated? Treatment depends on what is causing your chest pain. Treatment may include:  Medicines. These may include: ? Acid blockers for heartburn. ? Anti-inflammatory medicine. ? Pain medicine for inflammatory conditions. ? Antibiotic medicine, if an infection is present. ? Medicines to dissolve blood clots. ? Medicines to treat coronary artery disease (CAD).  Supportive care for conditions that   do not require medicines. This may include: ? Resting. ? Applying heat or cold packs to injured areas. ? Limiting activities until pain decreases.  Follow these instructions at home: Medicines  If you were prescribed an antibiotic, take it as told by your health care provider. Do not stop taking the  antibiotic even if you start to feel better.  Take over-the-counter and prescription medicines only as told by your health care provider. Lifestyle  Do not use any products that contain nicotine or tobacco, such as cigarettes and e-cigarettes. If you need help quitting, ask your health care provider.  Do not drink alcohol.  Make lifestyle changes as directed by your health care provider. These may include: ? Getting regular exercise. Ask your health care provider to suggest some activities that are safe for you. ? Eating a heart-healthy diet. A registered dietitian can help you to learn healthy eating options. ? Maintaining a healthy weight. ? Managing diabetes, if necessary. ? Reducing stress, such as with yoga or relaxation techniques. General instructions  Avoid any activities that bring on chest pain.  If heartburn is the cause for your chest pain, raise (elevate) the head of your bed about 6 inches (15 cm) by putting blocks under the legs. Sleeping with more pillows does not effectively relieve heartburn because it only changes the position of your head.  Keep all follow-up visits as told by your health care provider. This is important. This includes any further testing if your chest pain does not go away. Contact a health care provider if:  Your chest pain does not go away.  You have a rash with blisters on your chest.  You have a fever.  You have chills. Get help right away if:  Your chest pain is worse.  You have a cough that gets worse, or you cough up blood.  You have severe pain in your abdomen.  You have severe weakness.  You faint.  You have sudden, unexplained chest discomfort.  You have sudden, unexplained discomfort in your arms, back, neck, or jaw.  You have shortness of breath at any time.  You suddenly start to sweat, or your skin gets clammy.  You feel nauseous or you vomit.  You suddenly feel light-headed or dizzy.  Your heart begins to beat  quickly, or it feels like it is skipping beats. These symptoms may represent a serious problem that is an emergency. Do not wait to see if the symptoms will go away. Get medical help right away. Call your local emergency services (911 in the [place] Do not drive yourself to the hospital. This information is not intended to replace advice given to you by your health care provider. Make sure you discuss any questions you have with your health care provider. Document Released: 08/11/2005 Document Revised: 07/26/2016 Document Reviewed: 07/26/2016 Elsevier Interactive Patient Education  2017 Elsevier Inc. Chest Wall Pain Chest wall pain is pain in or around the bones and muscles of your chest. Sometimes, an injury causes this pain. Sometimes, the cause may not be known. This pain may take several weeks or longer to get better. Follow these instructions at home: Pay attention to any changes in your symptoms. Take these actions to help with your pain:  Rest as told by your health care provider.  Avoid activities that cause pain. These include any activities that use your chest muscles or your abdominal and side muscles to lift heavy items.  If directed, apply ice to the painful area: ? Put  ice in a plastic bag. ? Place a towel between your skin and the bag. ? Leave the ice on for 20 minutes, 2-3 times per day.  Take over-the-counter and prescription medicines only as told by your health care provider.  Do not use tobacco products, including cigarettes, chewing tobacco, and e-cigarettes. If you need help quitting, ask your health care provider.  Keep all follow-up visits as told by your health care provider. This is important.  Contact a health care provider if:  You have a fever.  Your chest pain becomes worse.  You have new symptoms. Get help right away if:  You have nausea or vomiting.  You feel sweaty or light-headed.  You have a cough with phlegm (sputum) or you cough up  blood.  You develop shortness of breath. This information is not intended to replace advice given to you by your health care provider. Make sure you discuss any questions you have with your health care provider. Document Released: 11/01/2005 Document Revised: 03/11/2016 Document Reviewed: 01/27/2015 Elsevier Interactive Patient Education  2017 Elsevier Inc.   Gastritis, Adult Gastritis is inflammation of the stomach. There are two kinds of gastritis:  Acute gastritis. This kind develops suddenly.  Chronic gastritis. This kind lasts for a long time.  Gastritis happens when the lining of the stomach becomes weak or gets damaged. Without treatment, gastritis can lead to stomach bleeding and ulcers. What are the causes? This condition may be caused by:  An infection.  Drinking too much alcohol.  Certain medicines.  Having too much acid in the stomach.  A disease of the intestines or stomach.  Stress.  What are the signs or symptoms? Symptoms of this condition include:  Pain or a burning in the upper abdomen.  Nausea.  Vomiting.  An uncomfortable feeling of fullness after eating.  In some cases, there are no symptoms. How is this diagnosed? This condition may be diagnosed with:  A description of your symptoms.  A physical exam.  Tests. These can include: ? Blood tests. ? Stool tests. ? A test in which a thin, flexible instrument with a light and camera on the end is passed down the esophagus and into the stomach (upper endoscopy). ? A test in which a sample of tissue is taken for testing (biopsy).  How is this treated? This condition may be treated with medicines. If the condition is caused by a bacterial infection, you may be given antibiotic medicines. If it is caused by too much acid in the stomach, you may get medicines called H2 blockers, proton pump inhibitors, or antacids. Treatment may also involve stopping the use of certain medicines, such as aspirin,  ibuprofen, or other nonsteroidal anti-inflammatory drugs (NSAIDs). Follow these instructions at home:  Take over-the-counter and prescription medicines only as told by your health care provider.  If you were prescribed an antibiotic, take it as told by your health care provider. Do not stop taking the antibiotic even if you start to feel better.  Drink enough fluid to keep your urine clear or pale yellow.  Eat small, frequent meals instead of large meals. Contact a health care provider if:  Your symptoms get worse.  Your symptoms return after treatment. Get help right away if:  You vomit blood or material that looks like coffee grounds.  You have black or dark red stools.  You are unable to keep fluids down.  Your abdominal pain gets worse.  You have a fever.  You do not feel better  after 1 week. This information is not intended to replace advice given to you by your health care provider. Make sure you discuss any questions you have with your health care provider. Document Released: 10/26/2001 Document Revised: 06/30/2016 Document Reviewed: 07/26/2015 Elsevier Interactive Patient Education  [employer]2018 Elsevier Inc.

## 2017-09-13 NOTE — Progress Notes (Signed)
Pt returns for BP check. Took 2 antacids approx 1 hr ago. Pain continues unchanged. However, it does seem to be relieved with massaging the area affected.

## 2017-09-13 NOTE — ED Triage Notes (Addendum)
Patient c/o intermittent sharp left sided chest pain radiating to left back since last night. Denies N/V/D, and SOB.

## 2017-09-15 ENCOUNTER — Telehealth: Payer: Self-pay | Admitting: Registered Nurse

## 2017-09-15 ENCOUNTER — Encounter: Payer: Self-pay | Admitting: Registered Nurse

## 2017-09-15 MED ORDER — OMEPRAZOLE 20 MG PO CPDR: 20.0000 mg | DELAYED_RELEASE_CAPSULE | Freq: Every day | ORAL | 0 refills | Status: DC

## 2017-09-15 NOTE — Telephone Encounter (Signed)
Reviewed EPIC care everywhere [facility] for re-[facility]uation after I saw her on Tuesday for atypical chest pain anterior left.  Negative/normal EKG/troponin/CXR.  Patient reported pain had worsened and that is why she went to ER.  Pain has improved and feeling better today.  Discussed take NSAIDS/pain medications with snack/meal.  Discussed with patient PPI recommended by ER provider and we have omeprazole on formulary.  Patient agreed to trial omeprazole DR 20mg  po daily #30 RF0 dispensed from [facility] to patient.  Discussed with patient valasartan/hydrochlorothiazide and prilosec can lower her serum magnesium levels.  Discussed eating magnesium rich food at each meal e.g. Green leafy vegetables, nuts/seeds, fruits rasberries, banana, legumes, seafood salmon, tuna, artichoke, avocado to help her maintain her serum magnesium levels.  Patient verbalized understanding information/instructions, agreed with plan of care and had no further questions at this time.

## 2017-09-21 DIAGNOSIS — X58XXXA Exposure to other specified factors, initial encounter: Secondary | ICD-10-CM | POA: Diagnosis not present

## 2017-09-21 DIAGNOSIS — S83232A Complex tear of medial meniscus, current injury, left knee, initial encounter: Secondary | ICD-10-CM | POA: Diagnosis not present

## 2017-09-21 DIAGNOSIS — G8918 Other acute postprocedural pain: Secondary | ICD-10-CM | POA: Diagnosis not present

## 2017-09-21 DIAGNOSIS — S83242A Other tear of medial meniscus, current injury, left knee, initial encounter: Secondary | ICD-10-CM | POA: Diagnosis not present

## 2017-09-21 DIAGNOSIS — Y999 Unspecified external cause status: Secondary | ICD-10-CM | POA: Diagnosis not present

## 2017-09-21 DIAGNOSIS — M94262 Chondromalacia, left knee: Secondary | ICD-10-CM | POA: Diagnosis not present

## 2017-09-26 DIAGNOSIS — M2242 Chondromalacia patellae, left knee: Secondary | ICD-10-CM | POA: Diagnosis not present

## 2017-09-26 DIAGNOSIS — M1712 Unilateral primary osteoarthritis, left knee: Secondary | ICD-10-CM | POA: Diagnosis not present

## 2017-09-26 DIAGNOSIS — S83242D Other tear of medial meniscus, current injury, left knee, subsequent encounter: Secondary | ICD-10-CM | POA: Diagnosis not present

## 2017-09-28 DIAGNOSIS — M1712 Unilateral primary osteoarthritis, left knee: Secondary | ICD-10-CM | POA: Diagnosis not present

## 2017-09-28 DIAGNOSIS — M2242 Chondromalacia patellae, left knee: Secondary | ICD-10-CM | POA: Diagnosis not present

## 2017-09-28 DIAGNOSIS — S83242D Other tear of medial meniscus, current injury, left knee, subsequent encounter: Secondary | ICD-10-CM | POA: Diagnosis not present

## 2017-09-29 ENCOUNTER — Other Ambulatory Visit (HOSPITAL_COMMUNITY): Payer: Self-pay | Admitting: Orthopedic Surgery

## 2017-09-29 DIAGNOSIS — M79605 Pain in left leg: Secondary | ICD-10-CM

## 2017-09-29 DIAGNOSIS — M7989 Other specified soft tissue disorders: Principal | ICD-10-CM

## 2017-09-29 DIAGNOSIS — M1712 Unilateral primary osteoarthritis, left knee: Secondary | ICD-10-CM | POA: Diagnosis not present

## 2017-09-30 ENCOUNTER — Ambulatory Visit (HOSPITAL_COMMUNITY)
Admission: RE | Admit: 2017-09-30 | Discharge: 2017-09-30 | Disposition: A | Discharge status: Home / Self Care | Payer: Federal, State, Local not specified - PPO | Source: Ambulatory Visit | Attending: Orthopedic Surgery | Admitting: Orthopedic Surgery

## 2017-09-30 DIAGNOSIS — M79605 Pain in left leg: Secondary | ICD-10-CM | POA: Insufficient documentation

## 2017-09-30 DIAGNOSIS — M7989 Other specified soft tissue disorders: Secondary | ICD-10-CM | POA: Insufficient documentation

## 2017-09-30 NOTE — Progress Notes (Signed)
*  PRELIMINARY RESULTS* Vascular Ultrasound Left lower extremity venous duplex has been completed.  Preliminary findings: No evidence of deep vein thrombosis or baker's cyst in the left lower extremity.   Preliminary results called to Dr. Madelon Lipsaffrey @ 12:00, [facility] to [facility].   Regina DonningCharlotte C Vaanya Rodriguez 09/30/2017, 12:00 PM

## 2017-10-04 DIAGNOSIS — S83242D Other tear of medial meniscus, current injury, left knee, subsequent encounter: Secondary | ICD-10-CM | POA: Diagnosis not present

## 2017-10-04 DIAGNOSIS — R7301 Impaired fasting glucose: Secondary | ICD-10-CM | POA: Diagnosis not present

## 2017-10-04 DIAGNOSIS — I10 Essential (primary) hypertension: Secondary | ICD-10-CM | POA: Diagnosis not present

## 2017-10-04 DIAGNOSIS — M2242 Chondromalacia patellae, left knee: Secondary | ICD-10-CM | POA: Diagnosis not present

## 2017-10-04 DIAGNOSIS — M1712 Unilateral primary osteoarthritis, left knee: Secondary | ICD-10-CM | POA: Diagnosis not present

## 2017-10-04 DIAGNOSIS — M940 Chondrocostal junction syndrome [Tietze]: Secondary | ICD-10-CM | POA: Diagnosis not present

## 2017-10-04 DIAGNOSIS — E782 Mixed hyperlipidemia: Secondary | ICD-10-CM | POA: Diagnosis not present

## 2017-10-11 DIAGNOSIS — S83242D Other tear of medial meniscus, current injury, left knee, subsequent encounter: Secondary | ICD-10-CM | POA: Diagnosis not present

## 2017-10-11 DIAGNOSIS — M1712 Unilateral primary osteoarthritis, left knee: Secondary | ICD-10-CM | POA: Diagnosis not present

## 2017-10-11 DIAGNOSIS — M2242 Chondromalacia patellae, left knee: Secondary | ICD-10-CM | POA: Diagnosis not present

## 2017-10-12 DIAGNOSIS — S83242D Other tear of medial meniscus, current injury, left knee, subsequent encounter: Secondary | ICD-10-CM | POA: Diagnosis not present

## 2017-10-12 DIAGNOSIS — M1712 Unilateral primary osteoarthritis, left knee: Secondary | ICD-10-CM | POA: Diagnosis not present

## 2017-10-12 DIAGNOSIS — M2242 Chondromalacia patellae, left knee: Secondary | ICD-10-CM | POA: Diagnosis not present

## 2017-10-17 DIAGNOSIS — M1712 Unilateral primary osteoarthritis, left knee: Secondary | ICD-10-CM | POA: Diagnosis not present

## 2017-11-14 DIAGNOSIS — M1712 Unilateral primary osteoarthritis, left knee: Secondary | ICD-10-CM | POA: Diagnosis not present

## 2017-12-12 DIAGNOSIS — S83242D Other tear of medial meniscus, current injury, left knee, subsequent encounter: Secondary | ICD-10-CM | POA: Diagnosis not present

## 2017-12-23 DIAGNOSIS — L821 Other seborrheic keratosis: Secondary | ICD-10-CM | POA: Diagnosis not present

## 2017-12-23 DIAGNOSIS — L738 Other specified follicular disorders: Secondary | ICD-10-CM | POA: Diagnosis not present

## 2018-01-17 DIAGNOSIS — K625 Hemorrhage of anus and rectum: Secondary | ICD-10-CM | POA: Diagnosis not present

## 2018-01-17 DIAGNOSIS — Z800 Family history of malignant neoplasm of digestive organs: Secondary | ICD-10-CM | POA: Diagnosis not present

## 2018-01-17 DIAGNOSIS — Z1211 Encounter for screening for malignant neoplasm of colon: Secondary | ICD-10-CM | POA: Diagnosis not present

## 2018-01-17 DIAGNOSIS — K5904 Chronic idiopathic constipation: Secondary | ICD-10-CM | POA: Diagnosis not present

## 2018-02-18 DIAGNOSIS — M1711 Unilateral primary osteoarthritis, right knee: Secondary | ICD-10-CM | POA: Diagnosis not present

## 2018-02-24 DIAGNOSIS — Z1211 Encounter for screening for malignant neoplasm of colon: Secondary | ICD-10-CM | POA: Diagnosis not present

## 2018-02-25 DIAGNOSIS — E782 Mixed hyperlipidemia: Secondary | ICD-10-CM | POA: Diagnosis not present

## 2018-02-25 DIAGNOSIS — F064 Anxiety disorder due to known physiological condition: Secondary | ICD-10-CM | POA: Diagnosis not present

## 2018-02-25 DIAGNOSIS — R7301 Impaired fasting glucose: Secondary | ICD-10-CM | POA: Diagnosis not present

## 2018-02-25 DIAGNOSIS — M130 Polyarthritis, unspecified: Secondary | ICD-10-CM | POA: Diagnosis not present

## 2018-02-25 DIAGNOSIS — I10 Essential (primary) hypertension: Secondary | ICD-10-CM | POA: Diagnosis not present

## 2018-03-20 DIAGNOSIS — M1711 Unilateral primary osteoarthritis, right knee: Secondary | ICD-10-CM | POA: Diagnosis not present

## 2018-03-24 DIAGNOSIS — M25561 Pain in right knee: Secondary | ICD-10-CM | POA: Diagnosis not present

## 2018-03-30 DIAGNOSIS — M25561 Pain in right knee: Secondary | ICD-10-CM | POA: Diagnosis not present

## 2018-04-26 DIAGNOSIS — X58XXXA Exposure to other specified factors, initial encounter: Secondary | ICD-10-CM | POA: Diagnosis not present

## 2018-04-26 DIAGNOSIS — M1711 Unilateral primary osteoarthritis, right knee: Secondary | ICD-10-CM | POA: Diagnosis not present

## 2018-04-26 DIAGNOSIS — G8918 Other acute postprocedural pain: Secondary | ICD-10-CM | POA: Diagnosis not present

## 2018-04-26 DIAGNOSIS — M948X6 Other specified disorders of cartilage, lower leg: Secondary | ICD-10-CM | POA: Diagnosis not present

## 2018-04-26 DIAGNOSIS — S83281A Other tear of lateral meniscus, current injury, right knee, initial encounter: Secondary | ICD-10-CM | POA: Diagnosis not present

## 2018-04-26 DIAGNOSIS — Y999 Unspecified external cause status: Secondary | ICD-10-CM | POA: Diagnosis not present

## 2018-04-26 DIAGNOSIS — S83231A Complex tear of medial meniscus, current injury, right knee, initial encounter: Secondary | ICD-10-CM | POA: Diagnosis not present

## 2018-04-26 DIAGNOSIS — S83241A Other tear of medial meniscus, current injury, right knee, initial encounter: Secondary | ICD-10-CM | POA: Diagnosis not present

## 2018-05-09 DIAGNOSIS — S83241D Other tear of medial meniscus, current injury, right knee, subsequent encounter: Secondary | ICD-10-CM | POA: Diagnosis not present

## 2018-05-25 DIAGNOSIS — S83241D Other tear of medial meniscus, current injury, right knee, subsequent encounter: Secondary | ICD-10-CM | POA: Diagnosis not present

## 2018-06-09 DIAGNOSIS — Z1231 Encounter for screening mammogram for malignant neoplasm of breast: Secondary | ICD-10-CM | POA: Diagnosis not present

## 2018-06-09 DIAGNOSIS — Z6833 Body mass index (BMI) 33.0-33.9, adult: Secondary | ICD-10-CM | POA: Diagnosis not present

## 2018-06-09 DIAGNOSIS — Z01419 Encounter for gynecological examination (general) (routine) without abnormal findings: Secondary | ICD-10-CM | POA: Diagnosis not present

## 2018-06-22 DIAGNOSIS — B999 Unspecified infectious disease: Secondary | ICD-10-CM | POA: Diagnosis not present

## 2018-06-22 DIAGNOSIS — S83241D Other tear of medial meniscus, current injury, right knee, subsequent encounter: Secondary | ICD-10-CM | POA: Diagnosis not present

## 2018-06-22 DIAGNOSIS — M25561 Pain in right knee: Secondary | ICD-10-CM | POA: Diagnosis not present

## 2018-08-14 DIAGNOSIS — R7301 Impaired fasting glucose: Secondary | ICD-10-CM | POA: Diagnosis not present

## 2018-08-14 DIAGNOSIS — I10 Essential (primary) hypertension: Secondary | ICD-10-CM | POA: Diagnosis not present

## 2018-08-14 DIAGNOSIS — E782 Mixed hyperlipidemia: Secondary | ICD-10-CM | POA: Diagnosis not present

## 2018-08-14 DIAGNOSIS — F064 Anxiety disorder due to known physiological condition: Secondary | ICD-10-CM | POA: Diagnosis not present

## 2018-08-18 DIAGNOSIS — E6609 Other obesity due to excess calories: Secondary | ICD-10-CM | POA: Diagnosis not present

## 2018-08-18 DIAGNOSIS — Z6834 Body mass index (BMI) 34.0-34.9, adult: Secondary | ICD-10-CM | POA: Diagnosis not present

## 2018-08-18 DIAGNOSIS — I10 Essential (primary) hypertension: Secondary | ICD-10-CM | POA: Diagnosis not present

## 2018-08-18 DIAGNOSIS — E1100 Type 2 diabetes mellitus with hyperosmolarity without nonketotic hyperglycemic-hyperosmolar coma (NKHHC): Secondary | ICD-10-CM | POA: Diagnosis not present

## 2018-08-26 ENCOUNTER — Ambulatory Visit: Payer: Federal, State, Local not specified - PPO | Admitting: Podiatry

## 2018-08-26 ENCOUNTER — Encounter: Payer: Self-pay | Admitting: Podiatry

## 2018-08-26 ENCOUNTER — Ambulatory Visit (INDEPENDENT_AMBULATORY_CARE_PROVIDER_SITE_OTHER): Payer: Federal, State, Local not specified - PPO

## 2018-08-26 VITALS — BP 137/60 | HR 76

## 2018-08-26 DIAGNOSIS — M779 Enthesopathy, unspecified: Secondary | ICD-10-CM | POA: Diagnosis not present

## 2018-08-26 DIAGNOSIS — M778 Other enthesopathies, not elsewhere classified: Secondary | ICD-10-CM

## 2018-08-26 DIAGNOSIS — E119 Type 2 diabetes mellitus without complications: Secondary | ICD-10-CM | POA: Diagnosis not present

## 2018-08-26 DIAGNOSIS — I872 Venous insufficiency (chronic) (peripheral): Secondary | ICD-10-CM | POA: Diagnosis not present

## 2018-08-26 DIAGNOSIS — M79675 Pain in left toe(s): Secondary | ICD-10-CM | POA: Diagnosis not present

## 2018-08-26 DIAGNOSIS — M2012 Hallux valgus (acquired), left foot: Secondary | ICD-10-CM

## 2018-08-26 DIAGNOSIS — G8929 Other chronic pain: Secondary | ICD-10-CM

## 2018-08-26 NOTE — Patient Instructions (Signed)
Pre-Operative Instructions  Congratulations, you have decided to take an important step towards improving your quality of life.  You can be assured that the doctors and staff at [facility] will be with you every step of the way.  Here are some important things you should know:  1. Plan to be at the surgery center/hospital at least 1 (one) hour prior to your scheduled time, unless otherwise directed by the surgical center/hospital staff.  You must have a responsible adult accompany you, remain during the surgery and drive you home.  Make sure you have directions to the surgical center/hospital to ensure you arrive on time. 2. If you are having surgery at [facility] or [facility], you will need a copy of your medical history and physical form from your family physician within one month prior to the date of surgery. We will give you a form for your primary physician to complete.  3. We make every effort to accommodate the date you request for surgery.  However, there are times where surgery dates or times have to be moved.  We will contact you as soon as possible if a change in schedule is required.   4. No aspirin/ibuprofen for one week before surgery.  If you are on aspirin, any non-steroidal anti-inflammatory medications (Mobic, Aleve, Ibuprofen) should not be taken seven (7) days prior to your surgery.  You make take Tylenol for pain prior to surgery.  5. Medications - If you are taking daily heart and blood pressure medications, seizure, reflux, allergy, asthma, anxiety, pain or diabetes medications, make sure you notify the surgery center/hospital before the day of surgery so they can tell you which medications you should take or avoid the day of surgery. 6. No food or drink after midnight the night before surgery unless directed otherwise by surgical center/hospital staff. 7. No alcoholic beverages 24-hours prior to surgery.  No smoking 24-hours prior or 24-hours after  surgery. 8. Wear loose pants or shorts. They should be loose enough to fit over bandages, boots, and casts. 9. Don't wear slip-on shoes. Sneakers are preferred. 10. Bring your boot with you to the surgery center/hospital.  Also bring crutches or a walker if your physician has prescribed it for you.  If you do not have this equipment, it will be provided for you after surgery. 11. If you have not been contacted by the surgery center/hospital by the day before your surgery, call to confirm the date and time of your surgery. 12. Leave-time from work may vary depending on the type of surgery you have.  Appropriate arrangements should be made prior to surgery with your employer. 13. Prescriptions will be provided immediately following surgery by your doctor.  Fill these as soon as possible after surgery and take the medication as directed. Pain medications will not be refilled on weekends and must be approved by the doctor. 14. Remove nail polish on the operative foot and avoid getting pedicures prior to surgery. 15. Wash the night before surgery.  The night before surgery wash the foot and leg well with water and the antibacterial soap provided. Be sure to pay special attention to beneath the toenails and in between the toes.  Wash for at least three (3) minutes. Rinse thoroughly with water and dry well with a towel.  Perform this wash unless told not to do so by your physician.  Enclosed: 1 Ice pack (please put in freezer the night before surgery)   1 Hibiclens skin cleaner     Pre-op instructions  If you have any questions regarding the instructions, please do not hesitate to call our office.  [place]: [address], [place], [place] [address] -- [phone]  [place]: [address] [place], [place] [address] -- [phone]  [place]: [address]  [place], [place] [address] -- [phone]  [place]: [address], [address], [place], [place] [address] -- [phone]  Website:  [email or web address] 

## 2018-08-26 NOTE — Progress Notes (Signed)
  Subjective:  Patient ID: Regina Rodriguez, female    DOB: 1961/11/10,  MRN: [id number]  Chief Complaint  Patient presents with  . Foot Pain    left foot pain    57 y.o. female presents with the above complaint.  Reports painful bunion deformity for several years.  Has tried shoe gear change without relief.  Wishes to discuss possible surgical correction.   Review of Systems: Negative except as noted in the HPI. Denies N/V/F/Ch.  History reviewed. No pertinent past medical history.  Current Outpatient Medications:  .  aspirin EC 81 MG tablet, Take 81 mg by mouth daily., Disp: , Rfl:  .  empagliflozin (JARDIANCE) 25 MG TABS tablet, Take 25 mg by mouth daily., Disp: , Rfl:  .  HYDROcodone-acetaminophen (NORCO/VICODIN) 5-325 MG tablet, take 1 tablet by mouth every 4 to 6 hours if needed for pain, Disp: , Rfl: 0 .  omeprazole (PRILOSEC) 20 MG capsule, Take 1 capsule (20 mg total) by mouth daily., Disp: 30 capsule, Rfl: 0 .  valsartan-hydrochlorothiazide (DIOVAN-HCT) 80-12.5 MG per tablet, Take 1 tablet by mouth daily., Disp: , Rfl:   Social History   Tobacco Use  Smoking Status Never Smoker    No Known Allergies Objective:   Vitals:   08/26/18 0838  BP: 137/60  Pulse: 76   There is no height or weight on file to calculate BMI. Constitutional Well developed. Well nourished.  Vascular Dorsalis pedis pulses palpable bilaterally. Posterior tibial pulses palpable bilaterally. Capillary refill normal to all digits.  No cyanosis or clubbing noted. Pedal hair growth normal.  Neurologic Normal speech. Oriented to person, place, and time. Epicritic sensation to light touch grossly present bilaterally.  Dermatologic Nails well groomed and normal in appearance. No open wounds. No skin lesions.  Orthopedic: Normal joint ROM without pain or crepitus bilaterally. HAV defomity L with POP   Radiographs: Taken and reviewed. Moderate HAV deformity. IM 14 degrees. Assessment:   1.  Hallux valgus, left   2. Controlled type 2 diabetes mellitus without complication, without long-term current use of insulin (HCC)   3. Venous (peripheral) insufficiency   4. Chronic toe pain, left foot   5. Capsulitis of left foot    Plan:  Patient was evaluated and treated and all questions answered.  HAV Deformity R; DM well Controlled per Pt -X-ray taken as above. -Discussed patient conservative care versus surgical care for this condition. -Patient has failed all conservative therapy and wishes to proceed with surgical intervention. All risks, benefits, and alternatives discussed with patient. No guarantees given. Consent reviewed and signed by patient. -Planned procedures: Correction L Foot Bunion with Serafina Royals   Return for post op.

## 2018-09-28 ENCOUNTER — Telehealth: Payer: Self-pay | Admitting: *Deleted

## 2018-09-28 NOTE — Telephone Encounter (Signed)
"  Dr. Samuella CotaPrice told me to call you to set up my surgery.  I think I need to schedule a consult with him."  You have signed the consent forms.  You do not need another appointment.  We can schedule your surgery.  "I'd like to do it after the Holidays."  He can do it on January 8.  "I want to have it done this year before my deductible starts over."  That date is the first available date Dr. Samuella CotaPrice has after the Holidays.  "Will my deductible start over in January?"  It will most likely start over.  "How much will it be?"  I do not know.  "I'll call my insurance and see what they say.  I'll call you back later today."

## 2018-09-29 DIAGNOSIS — E1110 Type 2 diabetes mellitus with ketoacidosis without coma: Secondary | ICD-10-CM | POA: Diagnosis not present

## 2018-09-29 DIAGNOSIS — Z6834 Body mass index (BMI) 34.0-34.9, adult: Secondary | ICD-10-CM | POA: Diagnosis not present

## 2018-09-29 DIAGNOSIS — I10 Essential (primary) hypertension: Secondary | ICD-10-CM | POA: Diagnosis not present

## 2018-10-02 NOTE — Telephone Encounter (Signed)
"  I was told that I could schedule my surgery for January 8.  I'd like to go ahead and schedule it for that date."  Okay, I'll get your surgery scheduled.  Someone from the surgical center will give you a call a day or two prior to your surgery date and they will give you your arrival time.  You need to go online and register with the surgery center, the instructions are in the brochure that we gave you.  "Thank you so much.  Is there a specific time that I need to register?"  No, you can register whenever it is a good time for you.

## 2018-11-22 ENCOUNTER — Other Ambulatory Visit: Payer: Self-pay | Admitting: Podiatry

## 2018-11-22 DIAGNOSIS — M25572 Pain in left ankle and joints of left foot: Secondary | ICD-10-CM | POA: Diagnosis not present

## 2018-11-22 DIAGNOSIS — E7800 Pure hypercholesterolemia, unspecified: Secondary | ICD-10-CM | POA: Diagnosis not present

## 2018-11-22 DIAGNOSIS — M2012 Hallux valgus (acquired), left foot: Secondary | ICD-10-CM | POA: Diagnosis not present

## 2018-11-22 DIAGNOSIS — M21612 Bunion of left foot: Secondary | ICD-10-CM | POA: Diagnosis not present

## 2018-11-22 MED ORDER — CEPHALEXIN 500 MG PO CAPS: 500.0000 mg | ORAL_CAPSULE | Freq: Two times a day (BID) | ORAL | 0 refills | Status: DC

## 2018-11-22 MED ORDER — OXYCODONE-ACETAMINOPHEN 10-325 MG PO TABS: 1.0000 | ORAL_TABLET | ORAL | 0 refills | Status: DC | PRN

## 2018-11-22 MED ORDER — PROMETHAZINE HCL 25 MG PO TABS: 25.0000 mg | ORAL_TABLET | Freq: Three times a day (TID) | ORAL | 0 refills | Status: DC | PRN

## 2018-11-24 ENCOUNTER — Ambulatory Visit (INDEPENDENT_AMBULATORY_CARE_PROVIDER_SITE_OTHER): Payer: Federal, State, Local not specified - PPO

## 2018-11-24 ENCOUNTER — Ambulatory Visit (INDEPENDENT_AMBULATORY_CARE_PROVIDER_SITE_OTHER): Payer: Federal, State, Local not specified - PPO | Admitting: Podiatry

## 2018-11-24 VITALS — Temp 99.3°F

## 2018-11-24 DIAGNOSIS — M2012 Hallux valgus (acquired), left foot: Secondary | ICD-10-CM | POA: Diagnosis not present

## 2018-11-24 NOTE — Progress Notes (Signed)
  Subjective:  Patient ID: Regina Rodriguez, female    DOB: [birth year],  MRN: [id number]  Chief Complaint  Patient presents with  . Routine Post [facility].08.2020 [facility] " my foot is really throbbing"     DOS: 11/22/2018 Procedure: [place] Bunionectomy Left  58 y.o. female returns for post-op check. Reports throbbing pain but otherwise doing well.  Review of Systems: Negative except as noted in the HPI. Denies N/V/F/Ch.  No past medical history on file.  Current Outpatient Medications:  .  aspirin EC 81 MG tablet, Take 81 mg by mouth daily., Disp: , Rfl:  .  cephALEXin (KEFLEX) 500 MG capsule, Take 1 capsule (500 mg total) by mouth 2 (two) times daily., Disp: 14 capsule, Rfl: 0 .  empagliflozin (JARDIANCE) 25 MG TABS tablet, Take 25 mg by mouth daily., Disp: , Rfl:  .  HYDROcodone-acetaminophen (NORCO/VICODIN) 5-325 MG tablet, take 1 tablet by mouth every 4 to 6 hours if needed for pain, Disp: , Rfl: 0 .  omeprazole (PRILOSEC) 20 MG capsule, Take 1 capsule (20 mg total) by mouth daily., Disp: 30 capsule, Rfl: 0 .  oxyCODONE-acetaminophen (PERCOCET) 10-325 MG tablet, Take 1 tablet by mouth every 4 (four) hours as needed for pain., Disp: 20 tablet, Rfl: 0 .  promethazine (PHENERGAN) 25 MG tablet, Take 1 tablet (25 mg total) by mouth every 8 (eight) hours as needed for nausea or vomiting., Disp: 20 tablet, Rfl: 0 .  valsartan-hydrochlorothiazide (DIOVAN-HCT) 80-12.5 MG per tablet, Take 1 tablet by mouth daily., Disp: , Rfl:   Social History   Tobacco Use  Smoking Status Never Smoker    No Known Allergies Objective:   Vitals:   11/24/18 1130  Temp: 99.3 F (37.4 C)   There is no height or weight on file to calculate BMI. Constitutional Well developed. Well nourished.  Vascular Foot warm and well perfused. Capillary refill normal to all digits.   Neurologic Normal speech. Oriented to person, place, and time. Epicritic sensation to light touch grossly present  bilaterally.  Dermatologic Skin healing well without signs of infection. Skin edges well coapted without signs of infection.  Orthopedic: Tenderness to palpation noted about the surgical site.   Radiographs: Taken and reviewed c/w Post-op state Assessment:   1. Hallux valgus, left    Plan:  Patient was evaluated and treated and all questions answered.  S/p foot surgery left -Progressing as expected post-operatively. -XR: As above -WB Status: WBAT in CAM boot -Sutures: intact. -Medications: none refilled today. -Foot redressed.  Return for keep current post op appt, Regina Rodriguez patient.

## 2018-11-30 ENCOUNTER — Ambulatory Visit (INDEPENDENT_AMBULATORY_CARE_PROVIDER_SITE_OTHER): Payer: Self-pay | Admitting: Podiatry

## 2018-11-30 DIAGNOSIS — Z9889 Other specified postprocedural states: Secondary | ICD-10-CM

## 2018-11-30 DIAGNOSIS — M79675 Pain in left toe(s): Secondary | ICD-10-CM

## 2018-11-30 DIAGNOSIS — G8929 Other chronic pain: Secondary | ICD-10-CM

## 2018-11-30 DIAGNOSIS — M2012 Hallux valgus (acquired), left foot: Secondary | ICD-10-CM

## 2018-11-30 NOTE — Progress Notes (Signed)
  Subjective:  Patient ID: Rudie MeyerVivian A Bourke, female    DOB: 09/09/1961,  MRN: [id number]  Chief Complaint  Patient presents with  . [facility]     dos 01.08.2020 [place] Bunionectomy Lt " my foot is feeling better this week"     DOS: 11/22/2018 Procedure: [place] Bunionectomy Left  58 y.o. female returns for post-op check.  States her foot is feeling better having a little bit of pain but it is improving.  Review of Systems: Negative except as noted in the HPI. Denies N/V/F/Ch.  No past medical history on file.  Current Outpatient Medications:  .  aspirin EC 81 MG tablet, Take 81 mg by mouth daily., Disp: , Rfl:  .  cephALEXin (KEFLEX) 500 MG capsule, Take 1 capsule (500 mg total) by mouth 2 (two) times daily., Disp: 14 capsule, Rfl: 0 .  empagliflozin (JARDIANCE) 25 MG TABS tablet, Take 25 mg by mouth daily., Disp: , Rfl:  .  HYDROcodone-acetaminophen (NORCO/VICODIN) 5-325 MG tablet, take 1 tablet by mouth every 4 to 6 hours if needed for pain, Disp: , Rfl: 0 .  omeprazole (PRILOSEC) 20 MG capsule, Take 1 capsule (20 mg total) by mouth daily., Disp: 30 capsule, Rfl: 0 .  oxyCODONE-acetaminophen (PERCOCET) 10-325 MG tablet, Take 1 tablet by mouth every 4 (four) hours as needed for pain., Disp: 20 tablet, Rfl: 0 .  promethazine (PHENERGAN) 25 MG tablet, Take 1 tablet (25 mg total) by mouth every 8 (eight) hours as needed for nausea or vomiting., Disp: 20 tablet, Rfl: 0 .  valsartan-hydrochlorothiazide (DIOVAN-HCT) 80-12.5 MG per tablet, Take 1 tablet by mouth daily., Disp: , Rfl:   Social History   Tobacco Use  Smoking Status Never Smoker    No Known Allergies Objective:   There were no vitals filed for this visit. There is no height or weight on file to calculate BMI. Constitutional Well developed. Well nourished.  Vascular Foot warm and well perfused. Capillary refill normal to all digits.   Neurologic Normal speech. Oriented to person, place, and time. Epicritic sensation  to light touch grossly present bilaterally.  Dermatologic Skin healing well without signs of infection. Skin edges well coapted without signs of infection.  Orthopedic: Tenderness to palpation noted about the surgical site.   Radiographs: None today.   Assessment:   1. Hallux valgus, left   2. Chronic toe pain, left foot   3. Post-operative state    Plan:  Patient was evaluated and treated and all questions answered.  S/p foot surgery left -Progressing as expected post-operatively. -XR: None today. -WB Status: WBAT in CAM boot -Sutures: intact. -Medications: none refilled today. -Foot redressed.  Return in about 2 weeks (around 12/14/2018) for Vignesh Willert patient.

## 2018-12-01 ENCOUNTER — Ambulatory Visit (INDEPENDENT_AMBULATORY_CARE_PROVIDER_SITE_OTHER): Payer: Federal, State, Local not specified - PPO

## 2018-12-01 DIAGNOSIS — M2012 Hallux valgus (acquired), left foot: Secondary | ICD-10-CM

## 2018-12-01 MED ORDER — OXYCODONE-ACETAMINOPHEN 10-325 MG PO TABS: 1.0000 | ORAL_TABLET | ORAL | 0 refills | Status: DC | PRN

## 2018-12-01 NOTE — Addendum Note (Signed)
Addended by: Ventura Sellers on: 12/01/2018 10:14 AM   Modules accepted: Orders

## 2018-12-05 NOTE — Progress Notes (Signed)
Patient is status post left foot surgery, she states when she got home her bandage was coming off and she is tried to rewrap it herself, but she needs it to be rewrapped by 1 of our nurses.  Left foot was redressed and dry sterile dressing along with compression.  She is to keep her current postop appointment for follow-up as needed with any acute symptom changes.

## 2018-12-07 ENCOUNTER — Other Ambulatory Visit: Payer: Self-pay

## 2018-12-07 ENCOUNTER — Ambulatory Visit (INDEPENDENT_AMBULATORY_CARE_PROVIDER_SITE_OTHER): Payer: Federal, State, Local not specified - PPO | Admitting: Podiatry

## 2018-12-07 DIAGNOSIS — Z9889 Other specified postprocedural states: Secondary | ICD-10-CM

## 2018-12-07 NOTE — Progress Notes (Signed)
  Subjective:  Patient ID: Regina Rodriguez, female    DOB: 06/10/1961,  MRN: [id number]  Chief Complaint  Patient presents with  . Routine Post Op    [facility]  swelling is down some - still having pain along incision    DOS: 11/22/2018 Procedure: [place] Bunionectomy Left  58 y.o. female returns for post-op check. History as above.  Review of Systems: Negative except as noted in the HPI. Denies N/V/F/Ch.  No past medical history on file.  Current Outpatient Medications:  .  aspirin EC 81 MG tablet, Take 81 mg by mouth daily., Disp: , Rfl:  .  cephALEXin (KEFLEX) 500 MG capsule, Take 1 capsule (500 mg total) by mouth 2 (two) times daily., Disp: 14 capsule, Rfl: 0 .  empagliflozin (JARDIANCE) 25 MG TABS tablet, Take 25 mg by mouth daily., Disp: , Rfl:  .  HYDROcodone-acetaminophen (NORCO/VICODIN) 5-325 MG tablet, take 1 tablet by mouth every 4 to 6 hours if needed for pain, Disp: , Rfl: 0 .  omeprazole (PRILOSEC) 20 MG capsule, Take 1 capsule (20 mg total) by mouth daily., Disp: 30 capsule, Rfl: 0 .  oxyCODONE-acetaminophen (PERCOCET) 10-325 MG tablet, Take 1 tablet by mouth every 4 (four) hours as needed for pain., Disp: 20 tablet, Rfl: 0 .  OZEMPIC, 1 MG/DOSE, 2 MG/1.5ML SOPN, , Disp: , Rfl:  .  promethazine (PHENERGAN) 25 MG tablet, Take 1 tablet (25 mg total) by mouth every 8 (eight) hours as needed for nausea or vomiting., Disp: 20 tablet, Rfl: 0 .  valsartan-hydrochlorothiazide (DIOVAN-HCT) 80-12.5 MG per tablet, Take 1 tablet by mouth daily., Disp: , Rfl:   Social History   Tobacco Use  Smoking Status Never Smoker    No Known Allergies Objective:   There were no vitals filed for this visit. There is no height or weight on file to calculate BMI. Constitutional Well developed. Well nourished.  Vascular Foot warm and well perfused. Capillary refill normal to all digits.   Neurologic Normal speech. Oriented to person, place, and  time. Epicritic sensation to light touch grossly present bilaterally.  Dermatologic Skin healing well without signs of infection. Skin edges well coapted without signs of infection.  Orthopedic: Tenderness to palpation noted about the surgical site. Good ROM. Toe rectus no medial prominence.   Radiographs: None today.   Assessment:   1. Post-operative state    Plan:  Patient was evaluated and treated and all questions answered.  S/p foot surgery left -Progressing as expected post-operatively. -XR: None today. -WB Status: WBAT in CAM boot -Sutures: intact. Absorbable. Ok to shower but not soak. -Medications: none refilled today. -Foot redressed.  Return in about 2 weeks (around 12/21/2018) for XRs, Post-op, Left.

## 2018-12-14 ENCOUNTER — Encounter: Payer: Federal, State, Local not specified - PPO | Admitting: Podiatry

## 2018-12-21 ENCOUNTER — Ambulatory Visit (INDEPENDENT_AMBULATORY_CARE_PROVIDER_SITE_OTHER): Payer: Federal, State, Local not specified - PPO | Admitting: Podiatry

## 2018-12-21 ENCOUNTER — Ambulatory Visit (INDEPENDENT_AMBULATORY_CARE_PROVIDER_SITE_OTHER): Payer: Federal, State, Local not specified - PPO

## 2018-12-21 ENCOUNTER — Encounter: Payer: Self-pay | Admitting: Podiatry

## 2018-12-21 DIAGNOSIS — M2012 Hallux valgus (acquired), left foot: Secondary | ICD-10-CM

## 2018-12-21 DIAGNOSIS — Z9889 Other specified postprocedural states: Secondary | ICD-10-CM

## 2018-12-22 NOTE — Progress Notes (Signed)
  Subjective:  Patient ID: Regina Rodriguez, female    DOB: 02/24/1961,  MRN: [id number]  Chief Complaint  Patient presents with  . Routine Post Op    DOS 11/22/2018 [place] Bunionectomy L; "painful today (4/10); last night was (8/10)"    DOS: 11/22/2018 Procedure: Eliberto Ivoryustin Bunionectomy Left  58 y.o. female returns for post-op check. History as above.  Review of Systems: Negative except as noted in the HPI. Denies N/V/F/Ch.  History reviewed. No pertinent past medical history.  Current Outpatient Medications:  .  aspirin EC 81 MG tablet, Take 81 mg by mouth daily., Disp: , Rfl:  .  cephALEXin (KEFLEX) 500 MG capsule, Take 1 capsule (500 mg total) by mouth 2 (two) times daily., Disp: 14 capsule, Rfl: 0 .  empagliflozin (JARDIANCE) 25 MG TABS tablet, Take 25 mg by mouth daily., Disp: , Rfl:  .  HYDROcodone-acetaminophen (NORCO/VICODIN) 5-325 MG tablet, take 1 tablet by mouth every 4 to 6 hours if needed for pain, Disp: , Rfl: 0 .  oxyCODONE-acetaminophen (PERCOCET) 10-325 MG tablet, Take 1 tablet by mouth every 4 (four) hours as needed for pain., Disp: 20 tablet, Rfl: 0 .  OZEMPIC, 1 MG/DOSE, 2 MG/1.5ML SOPN, , Disp: , Rfl:  .  promethazine (PHENERGAN) 25 MG tablet, Take 1 tablet (25 mg total) by mouth every 8 (eight) hours as needed for nausea or vomiting., Disp: 20 tablet, Rfl: 0 .  valsartan-hydrochlorothiazide (DIOVAN-HCT) 80-12.5 MG per tablet, Take 1 tablet by mouth daily., Disp: , Rfl:  .  omeprazole (PRILOSEC) 20 MG capsule, Take 1 capsule (20 mg total) by mouth daily., Disp: 30 capsule, Rfl: 0  Social History   Tobacco Use  Smoking Status Never Smoker  Smokeless Tobacco Never Used    No Known Allergies Objective:   There were no vitals filed for this visit. There is no height or weight on file to calculate BMI. Constitutional Well developed. Well nourished.  Vascular Foot warm and well perfused. Capillary refill normal to all digits.   Neurologic Normal speech. Oriented to  person, place, and time. Epicritic sensation to light touch grossly present bilaterally.  Dermatologic Skin well healed.  Orthopedic: Minimal tenderness to palpation noted about the surgical site. Good ROM. Toe rectus no medial prominence.   Radiographs: Taken and reviewed. Osteotomy appears bridged hardware intact. Assessment:   1. Hallux valgus, left   2. Post-operative state    Plan:  Patient was evaluated and treated and all questions answered.  S/p foot surgery left -Progressing as expected post-operatively. -XR: As above -WB Status: Transition to surgical shoe. WBAT in shoe. -Sutures: Skin healed -Continue ROM exercises. -Medications: none refilled today. -Foot redressed.  No follow-ups on file.

## 2018-12-25 ENCOUNTER — Encounter: Payer: Self-pay | Admitting: Podiatry

## 2019-01-04 ENCOUNTER — Ambulatory Visit (INDEPENDENT_AMBULATORY_CARE_PROVIDER_SITE_OTHER): Payer: Federal, State, Local not specified - PPO | Admitting: Podiatry

## 2019-01-04 ENCOUNTER — Ambulatory Visit (INDEPENDENT_AMBULATORY_CARE_PROVIDER_SITE_OTHER): Payer: Federal, State, Local not specified - PPO

## 2019-01-04 DIAGNOSIS — M2012 Hallux valgus (acquired), left foot: Secondary | ICD-10-CM | POA: Diagnosis not present

## 2019-01-15 NOTE — Progress Notes (Signed)
  Subjective:  Patient ID: Regina Rodriguez, female    DOB: August 14, 1961,  MRN: [id number]  Chief Complaint  Patient presents with  . Routine Post Op    left foot bunionectomy post op #5, pt has pain that is on and off, pt has a sharp pain at the incision point, pt has been doing toe exercisies,surgical shoe has been helping, pt puts pain on a 7-8 out of 10    DOS: 11/22/2018 Procedure: Eliberto Ivory Bunionectomy Left  58 y.o. female returns for post-op check. History as above.Overall doing well with only occasional pain.  Review of Systems: Negative except as noted in the HPI. Denies N/V/F/Ch.  No past medical history on file.  Current Outpatient Medications:  .  aspirin EC 81 MG tablet, Take 81 mg by mouth daily., Disp: , Rfl:  .  cephALEXin (KEFLEX) 500 MG capsule, Take 1 capsule (500 mg total) by mouth 2 (two) times daily., Disp: 14 capsule, Rfl: 0 .  empagliflozin (JARDIANCE) 25 MG TABS tablet, Take 25 mg by mouth daily., Disp: , Rfl:  .  HYDROcodone-acetaminophen (NORCO/VICODIN) 5-325 MG tablet, take 1 tablet by mouth every 4 to 6 hours if needed for pain, Disp: , Rfl: 0 .  oxyCODONE-acetaminophen (PERCOCET) 10-325 MG tablet, Take 1 tablet by mouth every 4 (four) hours as needed for pain., Disp: 20 tablet, Rfl: 0 .  OZEMPIC, 1 MG/DOSE, 2 MG/1.5ML SOPN, , Disp: , Rfl:  .  promethazine (PHENERGAN) 25 MG tablet, Take 1 tablet (25 mg total) by mouth every 8 (eight) hours as needed for nausea or vomiting., Disp: 20 tablet, Rfl: 0 .  valsartan-hydrochlorothiazide (DIOVAN-HCT) 80-12.5 MG per tablet, Take 1 tablet by mouth daily., Disp: , Rfl:  .  omeprazole (PRILOSEC) 20 MG capsule, Take 1 capsule (20 mg total) by mouth daily., Disp: 30 capsule, Rfl: 0  Social History   Tobacco Use  Smoking Status Never Smoker  Smokeless Tobacco Never Used    No Known Allergies Objective:   There were no vitals filed for this visit. There is no height or weight on file to calculate BMI. Constitutional Well  developed. Well nourished.  Vascular Foot warm and well perfused. Capillary refill normal to all digits.   Neurologic Normal speech. Oriented to person, place, and time. Epicritic sensation to light touch grossly present bilaterally.  Dermatologic Skin well healed.  Orthopedic: Minimal tenderness to palpation noted about the surgical site. Good ROM. Toe rectus no medial prominence.   Radiographs: Taken and reviewed. Osteotomy healed. Assessment:   1. Hallux valgus, left    Plan:  Patient was evaluated and treated and all questions answered.  S/p foot surgery left -Progressing as expected post-operatively. -XR: As above -WB Status: WBAT in normal shoegear -Sutures: Skin healed -Continue ROM exercises. -Medications: none refilled today. -Foot redressed.  Return in about 2 weeks (around 01/18/2019).

## 2019-01-18 ENCOUNTER — Encounter: Payer: Self-pay | Admitting: Podiatry

## 2019-01-18 ENCOUNTER — Ambulatory Visit (INDEPENDENT_AMBULATORY_CARE_PROVIDER_SITE_OTHER): Payer: Federal, State, Local not specified - PPO | Admitting: Podiatry

## 2019-01-18 DIAGNOSIS — M2012 Hallux valgus (acquired), left foot: Secondary | ICD-10-CM | POA: Diagnosis not present

## 2019-01-18 NOTE — Progress Notes (Signed)
  Subjective:  Patient ID: Regina Rodriguez, female    DOB: [birth year],  MRN: [id number]  Chief Complaint  Patient presents with  . Routine Post Op    Serafina Royals R- DOS 11/22/2018; "doing alright with little sharp pain thru toe still; not comfortable with wearing regular shoe yet"    DOS: 11/22/2018 Procedure: Eliberto Ivory Bunionectomy Left  58 y.o. female returns for post-op check. History as above.  Still not able to wear her normal shoe all day just yet.  Will be going back to work next week.  Review of Systems: Negative except as noted in the HPI. Denies N/V/F/Ch.  History reviewed. No pertinent past medical history.  Current Outpatient Medications:  .  aspirin EC 81 MG tablet, Take 81 mg by mouth daily., Disp: , Rfl:  .  cephALEXin (KEFLEX) 500 MG capsule, Take 1 capsule (500 mg total) by mouth 2 (two) times daily., Disp: 14 capsule, Rfl: 0 .  empagliflozin (JARDIANCE) 25 MG TABS tablet, Take 25 mg by mouth daily., Disp: , Rfl:  .  HYDROcodone-acetaminophen (NORCO/VICODIN) 5-325 MG tablet, take 1 tablet by mouth every 4 to 6 hours if needed for pain, Disp: , Rfl: 0 .  oxyCODONE-acetaminophen (PERCOCET) 10-325 MG tablet, Take 1 tablet by mouth every 4 (four) hours as needed for pain., Disp: 20 tablet, Rfl: 0 .  OZEMPIC, 1 MG/DOSE, 2 MG/1.5ML SOPN, , Disp: , Rfl:  .  promethazine (PHENERGAN) 25 MG tablet, Take 1 tablet (25 mg total) by mouth every 8 (eight) hours as needed for nausea or vomiting., Disp: 20 tablet, Rfl: 0 .  valsartan-hydrochlorothiazide (DIOVAN-HCT) 80-12.5 MG per tablet, Take 1 tablet by mouth daily., Disp: , Rfl:  .  omeprazole (PRILOSEC) 20 MG capsule, Take 1 capsule (20 mg total) by mouth daily., Disp: 30 capsule, Rfl: 0  Social History   Tobacco Use  Smoking Status Never Smoker  Smokeless Tobacco Never Used    No Known Allergies Objective:   There were no vitals filed for this visit. There is no height or weight on file to calculate BMI. Constitutional  Well developed. Well nourished.  Vascular Foot warm and well perfused. Capillary refill normal to all digits.   Neurologic Normal speech. Oriented to person, place, and time. Epicritic sensation to light touch grossly present bilaterally.  Dermatologic Skin well healed.  Orthopedic: No tenderness to palpation noted about the surgical site. Good ROM. Toe rectus no medial prominence.   Radiographs: None today. Assessment:   1. Hallux valgus, left    Plan:  Patient was evaluated and treated and all questions answered.  S/p foot surgery left -Progressing as expected post-operatively. -XR: As above -WB Status: WBAT in normal shoegear -Sutures: Skin healed -Continue ROM exercises.  -Toe alignment splint dispensed, applied, educated on use. -Medications: none refilled today. -We will write note for work with restrictions for return -Foot redressed.  No follow-ups on file.

## 2019-01-29 ENCOUNTER — Telehealth: Payer: Self-pay | Admitting: Podiatry

## 2019-01-29 ENCOUNTER — Encounter: Payer: Self-pay | Admitting: *Deleted

## 2019-01-29 NOTE — Telephone Encounter (Signed)
Patient is still in a lot of pain. She is requesting to be out of work a little bit longer. She went back to work and still has pain. Please call patient

## 2019-01-29 NOTE — Telephone Encounter (Signed)
I called pt and told her Dr. Marylouise Stacks for her to be out of work until reevaluated 02/16/2019. Pt states she will pick up a letter today and to fax to [phone], [place] Attn: Ignacia Marvel ID# [id number].

## 2019-02-14 DIAGNOSIS — Z7189 Other specified counseling: Secondary | ICD-10-CM | POA: Diagnosis not present

## 2019-02-14 DIAGNOSIS — E1169 Type 2 diabetes mellitus with other specified complication: Secondary | ICD-10-CM | POA: Diagnosis not present

## 2019-02-14 DIAGNOSIS — E7800 Pure hypercholesterolemia, unspecified: Secondary | ICD-10-CM | POA: Diagnosis not present

## 2019-02-14 DIAGNOSIS — I10 Essential (primary) hypertension: Secondary | ICD-10-CM | POA: Diagnosis not present

## 2019-02-15 ENCOUNTER — Other Ambulatory Visit: Payer: Self-pay

## 2019-02-15 ENCOUNTER — Telehealth: Payer: Self-pay | Admitting: *Deleted

## 2019-02-15 ENCOUNTER — Telehealth: Payer: Self-pay | Admitting: Podiatry

## 2019-02-15 ENCOUNTER — Encounter: Payer: Self-pay | Admitting: Podiatry

## 2019-02-15 ENCOUNTER — Ambulatory Visit (INDEPENDENT_AMBULATORY_CARE_PROVIDER_SITE_OTHER): Payer: Federal, State, Local not specified - PPO

## 2019-02-15 ENCOUNTER — Ambulatory Visit (INDEPENDENT_AMBULATORY_CARE_PROVIDER_SITE_OTHER): Payer: Federal, State, Local not specified - PPO | Admitting: Podiatry

## 2019-02-15 VITALS — Temp 97.4°F

## 2019-02-15 DIAGNOSIS — M2012 Hallux valgus (acquired), left foot: Secondary | ICD-10-CM | POA: Diagnosis not present

## 2019-02-15 DIAGNOSIS — L910 Hypertrophic scar: Secondary | ICD-10-CM

## 2019-02-15 DIAGNOSIS — Z9889 Other specified postprocedural states: Secondary | ICD-10-CM

## 2019-02-15 MED ORDER — NONFORMULARY OR COMPOUNDED ITEM: 5 refills | Status: AC

## 2019-02-15 NOTE — Telephone Encounter (Signed)
Faxed orders to [facility]. 

## 2019-02-15 NOTE — Telephone Encounter (Signed)
-----   Message from Park Liter, DPM sent at 02/15/2019  9:14 AM EDT ----- Can we order scar cream

## 2019-02-15 NOTE — Progress Notes (Signed)
  Subjective:  Patient ID: Regina Rodriguez, female    DOB: [birth year],  MRN: [id number]  Chief Complaint  Patient presents with  . Routine Post Op    DOS 11/22/2018  [place] Bunionectomy left   "I'm just having some sharp pains now that I'm back in my regular shoes" Requesting oxycodone refill as well    DOS: 11/22/2018 Procedure: Eliberto Ivory Bunionectomy Left  58 y.o. female returns for post-op check. History as above.  Still not able to wear her normal shoe all day just yet.  Will be going back to work next week.  Review of Systems: Negative except as noted in the HPI. Denies N/V/F/Ch.  No past medical history on file.  Current Outpatient Medications:  .  aspirin EC 81 MG tablet, Take 81 mg by mouth daily., Disp: , Rfl:  .  empagliflozin (JARDIANCE) 25 MG TABS tablet, Take 25 mg by mouth daily., Disp: , Rfl:  .  meloxicam (MOBIC) 15 MG tablet, , Disp: , Rfl:  .  omeprazole (PRILOSEC) 20 MG capsule, Take 1 capsule (20 mg total) by mouth daily., Disp: 30 capsule, Rfl: 0 .  oxyCODONE-acetaminophen (PERCOCET) 10-325 MG tablet, Take 1 tablet by mouth every 4 (four) hours as needed for pain., Disp: 20 tablet, Rfl: 0 .  OZEMPIC, 1 MG/DOSE, 2 MG/1.5ML SOPN, , Disp: , Rfl:  .  valsartan-hydrochlorothiazide (DIOVAN-HCT) 80-12.5 MG per tablet, Take 1 tablet by mouth daily., Disp: , Rfl:   Social History   Tobacco Use  Smoking Status Never Smoker  Smokeless Tobacco Never Used    No Known Allergies Objective:   Vitals:   02/15/19 0901  Temp: (!) 97.4 F (36.3 C)   There is no height or weight on file to calculate BMI. Constitutional Well developed. Well nourished.  Vascular Foot warm and well perfused. Capillary refill normal to all digits.   Neurologic Normal speech. Oriented to person, place, and time. Epicritic sensation to light touch grossly present bilaterally.  Dermatologic Skin well healed.  Orthopedic: No tenderness to palpation noted about the surgical site. Good ROM. Toe  rectus no medial prominence.   Radiographs: None today. Assessment:   1. Hav (hallux abducto valgus), left   2. Status post left foot surgery   3. Keloid scar    Plan:  Patient was evaluated and treated and all questions answered.  Hx Left Bunion correction -Injection delivered to painful scar -Rx scar cream  Return in about 4 weeks (around 03/15/2019) for bunion f/u.

## 2019-02-15 NOTE — Telephone Encounter (Signed)
Pt called asking for a note to be faxed to her employer (attention [employer]ie Bailey # [phone] and [employer](fax # [phone]) stating she will be out of work until her next appt on 4.30.2020 with Dr Samuella Cota.  The note was faxed to both by [facility] and we received confirmation

## 2019-02-16 ENCOUNTER — Encounter: Payer: Federal, State, Local not specified - PPO | Admitting: Podiatry

## 2019-02-16 ENCOUNTER — Telehealth: Payer: Self-pay | Admitting: Podiatry

## 2019-02-16 MED ORDER — OXYCODONE-ACETAMINOPHEN 10-325 MG PO TABS: 1.0000 | ORAL_TABLET | ORAL | 0 refills | Status: DC | PRN

## 2019-02-16 NOTE — Telephone Encounter (Signed)
Patient stated that her pharmacy did not get her pain prescription following her visit with Dr. Samuella Cota yesterday.

## 2019-02-16 NOTE — Addendum Note (Signed)
Addended by: Ventura Sellers on: 02/16/2019 11:57 AM   Modules accepted: Orders

## 2019-02-16 NOTE — Telephone Encounter (Signed)
I informed pt of Dr. Price's refill. 

## 2019-03-15 ENCOUNTER — Encounter: Payer: Self-pay | Admitting: Podiatry

## 2019-03-15 ENCOUNTER — Other Ambulatory Visit: Payer: Self-pay

## 2019-03-15 ENCOUNTER — Ambulatory Visit: Payer: Federal, State, Local not specified - PPO | Admitting: Podiatry

## 2019-03-15 VITALS — Temp 97.3°F

## 2019-03-15 DIAGNOSIS — L910 Hypertrophic scar: Secondary | ICD-10-CM

## 2019-03-15 NOTE — Progress Notes (Signed)
  Subjective:  Patient ID: Regina Rodriguez, female    DOB: February 08, 1961,  MRN: [id number]  Chief Complaint  Patient presents with  . Routine Post Op    [place] Bunionectomy (11/22/18) L; "little pain today (5/10); still have swelling but some days are better than others"   DOS: 11/22/2018 Procedure: Eliberto Ivory Bunionectomy Left  58 y.o. female returns for post-op check. History as above. Better able to tolerate shoegear. Still having pain from the thickness of her scar.  Review of Systems: Negative except as noted in the HPI. Denies N/V/F/Ch.  History reviewed. No pertinent past medical history.  Current Outpatient Medications:  .  aspirin EC 81 MG tablet, Take 81 mg by mouth daily., Disp: , Rfl:  .  empagliflozin (JARDIANCE) 25 MG TABS tablet, Take 25 mg by mouth daily., Disp: , Rfl:  .  meloxicam (MOBIC) 15 MG tablet, , Disp: , Rfl:  .  NONFORMULARY OR COMPOUNDED ITEM, [facility]:  Scar Cream - Verapamil 10%, Pentoxifylline 5%. Apply 1-2 grams to affected areas 3-4 times daily., Disp: 100 each, Rfl: 5 .  oxyCODONE-acetaminophen (PERCOCET) 10-325 MG tablet, Take 1 tablet by mouth every 4 (four) hours as needed for pain., Disp: 20 tablet, Rfl: 0 .  OZEMPIC, 1 MG/DOSE, 2 MG/1.5ML SOPN, , Disp: , Rfl:  .  valsartan-hydrochlorothiazide (DIOVAN-HCT) 80-12.5 MG per tablet, Take 1 tablet by mouth daily., Disp: , Rfl:  .  omeprazole (PRILOSEC) 20 MG capsule, Take 1 capsule (20 mg total) by mouth daily., Disp: 30 capsule, Rfl: 0  Social History   Tobacco Use  Smoking Status Never Smoker  Smokeless Tobacco Never Used    No Known Allergies Objective:   Vitals:   03/15/19 0941  Temp: (!) 97.3 F (36.3 C)   There is no height or weight on file to calculate BMI. Constitutional Well developed. Well nourished.  Vascular Foot warm and well perfused. Capillary refill normal to all digits.   Neurologic Normal speech. Oriented to person, place, and time. Epicritic sensation to light touch  grossly present bilaterally.  Dermatologic Skin well healed. Thickened painful hypertrophic scar left 1st MPJ.  Orthopedic: No tenderness to palpation noted about the surgical site. Good ROM. Toe rectus no medial prominence.   Radiographs: None today. Assessment:   1. Keloid scar    Plan:  Patient was evaluated and treated and all questions answered.  Hx Left Bunion correction -Continue scar cream -Injection delivered to keloid scar  Procedure: Lesion Injection Location: Left keloid scar Skin Prep: Alcohol. Injectate: 0.5 cc 1% lidocaine plain, 0.5 cc dexamethasone phosphate. Disposition: Patient tolerated procedure well. Injection site dressed with a band-aid.   Return in about 6 weeks (around 04/26/2019) for Bunion f/u .

## 2019-04-26 ENCOUNTER — Ambulatory Visit (INDEPENDENT_AMBULATORY_CARE_PROVIDER_SITE_OTHER): Payer: Federal, State, Local not specified - PPO | Admitting: Podiatry

## 2019-04-26 DIAGNOSIS — Z5329 Procedure and treatment not carried out because of patient's decision for other reasons: Secondary | ICD-10-CM

## 2019-05-16 NOTE — Progress Notes (Signed)
   Complete physical exam  Patient: Regina Rodriguez   DOB: 09/04/1998   58 y.o. Female  MRN: [id number]  Subjective:    No chief complaint on file.   Regina Rodriguez is a 58 y.o. female who presents today for a complete physical exam. She reports consuming a {diet types:17450} diet. {types:19826} She generally feels {DESC; WELL/FAIRLY WELL/POORLY:18703}. She reports sleeping {DESC; WELL/FAIRLY WELL/POORLY:18703}. She {does/does not:200015} have additional problems to discuss today.    Most recent fall risk assessment:    05/12/2022   10:42 AM  Fall Risk   Falls in the past year? 0  Number falls in past yr: 0  Injury with Fall? 0  Risk for fall due to : No Fall Risks  Follow up Falls evaluation completed     Most recent depression screenings:    05/12/2022   10:42 AM 04/02/2021   10:46 AM  PHQ 2/9 Scores  PHQ - 2 Score 0 0  PHQ- 9 Score 5     {VISON DENTAL STD PSA (Optional):27386}  {History (Optional):23778}  Patient Care Team: Jessup, Joy, NP as PCP - General (Nurse Practitioner)   Outpatient Medications Prior to Visit  Medication Sig   fluticasone (FLONASE) 50 MCG/ACT nasal spray Place 2 sprays into both nostrils in the morning and at bedtime. After 7 days, reduce to once daily.   norgestimate-ethinyl estradiol (SPRINTEC 28) 0.25-35 MG-MCG tablet Take 1 tablet by mouth daily.   Nystatin POWD Apply liberally to affected area 2 times per day   spironolactone (ALDACTONE) 100 MG tablet Take 1 tablet (100 mg total) by mouth daily.   No facility-administered medications prior to visit.    ROS        Objective:     There were no vitals taken for this visit. {Vitals History (Optional):23777}  Physical Exam   No results found for any visits on 06/17/22. {Show previous labs (optional):23779}    Assessment & Plan:    Routine Health Maintenance and Physical Exam  Immunization History  Administered Date(s) Administered   DTaP 11/18/1999, 01/14/2000,  03/24/2000, 12/08/2000, 06/23/2004   Hepatitis A 04/19/2008, 04/25/2009   Hepatitis B 09/05/1999, 10/13/1999, 03/24/2000   HiB (PRP-OMP) 11/18/1999, 01/14/2000, 03/24/2000, 12/08/2000   IPV 11/18/1999, 01/14/2000, 09/12/2000, 06/23/2004   Influenza,inj,Quad PF,6+ Mos 07/26/2014   Influenza-Unspecified 10/25/2012   MMR 09/12/2001, 06/23/2004   Meningococcal Polysaccharide 04/24/2012   Pneumococcal Conjugate-13 12/08/2000   Pneumococcal-Unspecified 03/24/2000, 06/07/2000   Tdap 04/24/2012   Varicella 09/12/2000, 04/19/2008    Health Maintenance  Topic Date Due   HIV Screening  Never done   Hepatitis C Screening  Never done   INFLUENZA VACCINE  06/15/2022   PAP-Cervical Cytology Screening  06/17/2022 (Originally 09/03/2020)   PAP SMEAR-Modifier  06/17/2022 (Originally 09/03/2020)   TETANUS/TDAP  06/17/2022 (Originally 04/24/2022)   HPV VACCINES  Discontinued   COVID-19 Vaccine  Discontinued    Discussed health benefits of physical activity, and encouraged her to engage in regular exercise appropriate for her age and condition.  Problem List Items Addressed This Visit   None Visit Diagnoses     Annual physical exam    -  Primary   Cervical cancer screening       Need for Tdap vaccination          No follow-ups on file.     Joy Jessup, NP   

## 2019-05-24 ENCOUNTER — Telehealth: Payer: Self-pay | Admitting: Podiatry

## 2019-05-24 ENCOUNTER — Encounter: Payer: Self-pay | Admitting: Podiatry

## 2019-05-24 ENCOUNTER — Other Ambulatory Visit: Payer: Self-pay

## 2019-05-24 ENCOUNTER — Ambulatory Visit: Payer: Federal, State, Local not specified - PPO | Admitting: Podiatry

## 2019-05-24 ENCOUNTER — Ambulatory Visit (INDEPENDENT_AMBULATORY_CARE_PROVIDER_SITE_OTHER): Payer: Federal, State, Local not specified - PPO

## 2019-05-24 VITALS — Temp 97.4°F

## 2019-05-24 DIAGNOSIS — M2012 Hallux valgus (acquired), left foot: Secondary | ICD-10-CM

## 2019-05-24 DIAGNOSIS — M778 Other enthesopathies, not elsewhere classified: Secondary | ICD-10-CM

## 2019-05-24 DIAGNOSIS — M2141 Flat foot [pes planus] (acquired), right foot: Secondary | ICD-10-CM

## 2019-05-24 DIAGNOSIS — M779 Enthesopathy, unspecified: Secondary | ICD-10-CM | POA: Diagnosis not present

## 2019-05-24 DIAGNOSIS — M2142 Flat foot [pes planus] (acquired), left foot: Secondary | ICD-10-CM | POA: Diagnosis not present

## 2019-05-24 MED ORDER — HYDROCODONE-ACETAMINOPHEN 5-325 MG PO TABS: 1.0000 | ORAL_TABLET | Freq: Four times a day (QID) | ORAL | 0 refills | Status: AC | PRN

## 2019-05-24 NOTE — Telephone Encounter (Signed)
Left message informing pt I would send her request to Dr. March Rummage, and to contact pharmacy.

## 2019-05-24 NOTE — Addendum Note (Signed)
Addended by: Hardie Pulley on: 05/24/2019 01:43 PM   Modules accepted: Orders

## 2019-05-24 NOTE — Progress Notes (Signed)
  Subjective:  Patient ID: Regina Rodriguez, female    DOB: [birth year],  MRN: [id number]  Chief Complaint  Patient presents with  . Bunions    f/u on left foot bunion, pt states, that she is often having a soreness on her left foot, pt is looking to get an injection, pt puts pain on a scale of 7 out of 10   DOS: 11/22/2018 Procedure: Liane Comber Bunionectomy Left  58 y.o. female returns for post-op check. History as above. Tolerating normal shoegear but every once in a while has a shooting pain in the inside of hte big toe are and top of hte 2nd toe area.  Review of Systems: Negative except as noted in the HPI. Denies N/V/F/Ch.  No past medical history on file.  Current Outpatient Medications:  .  aspirin EC 81 MG tablet, Take 81 mg by mouth daily., Disp: , Rfl:  .  empagliflozin (JARDIANCE) 25 MG TABS tablet, Take 25 mg by mouth daily., Disp: , Rfl:  .  meloxicam (MOBIC) 15 MG tablet, , Disp: , Rfl:  .  NONFORMULARY OR COMPOUNDED ITEM, [facility]:  Scar Cream - Verapamil 10%, Pentoxifylline 5%. Apply 1-2 grams to affected areas 3-4 times daily., Disp: 100 each, Rfl: 5 .  oxyCODONE-acetaminophen (PERCOCET) 10-325 MG tablet, Take 1 tablet by mouth every 4 (four) hours as needed for pain., Disp: 20 tablet, Rfl: 0 .  OZEMPIC, 1 MG/DOSE, 2 MG/1.5ML SOPN, , Disp: , Rfl:  .  valsartan-hydrochlorothiazide (DIOVAN-HCT) 80-12.5 MG per tablet, Take 1 tablet by mouth daily., Disp: , Rfl:  .  omeprazole (PRILOSEC) 20 MG capsule, Take 1 capsule (20 mg total) by mouth daily., Disp: 30 capsule, Rfl: 0  Social History   Tobacco Use  Smoking Status Never Smoker  Smokeless Tobacco Never Used    No Known Allergies Objective:   Vitals:   05/24/19 0903  Temp: (!) 97.4 F (36.3 C)   There is no height or weight on file to calculate BMI. Constitutional Well developed. Well nourished.  Vascular Foot warm and well perfused. Capillary refill normal to all digits.   Neurologic Normal speech.  Oriented to person, place, and time. Epicritic sensation to light touch grossly present bilaterally.  Dermatologic Skin well healed. Thickened painful hypertrophic scar left 1st MPJ.  Orthopedic: No tenderness to palpation about the medial 1st MPJ. Good DF but limited PF of the 1st MPJ. POP 2nd MPJ.   Radiographs: None today. Assessment:   1. Hav (hallux abducto valgus), left   2. Capsulitis of left foot   3. Pes planus of both feet    Plan:  Patient was evaluated and treated and all questions answered.  Hx Left Bunion correction, capsulitis 2nd MPJ left  -Still having slight pain, likely 2/2 1st MPJ stiffness and 2nd MPJ overload. Discussed ROM exercises. -Toe alignment splint dispensed. Educated on use. -Cast for custom molded orthotics to offload the 1st nad 2nd MPJ. -Will write note for return to work.  Return in about 2 months (around 07/25/2019) for Hallux valgus, capsulitis f/u .

## 2019-05-24 NOTE — Telephone Encounter (Signed)
Pain medication needs refilled send to [facility] on [address] when possible. Said she didn't mention to  Dr March Rummage during visit.

## 2019-05-24 NOTE — Telephone Encounter (Signed)
I informed pt of Dr. Eleanora Neighbor statement that he had sent a lower strength pain medication.

## 2019-07-06 DIAGNOSIS — Z6831 Body mass index (BMI) 31.0-31.9, adult: Secondary | ICD-10-CM | POA: Diagnosis not present

## 2019-07-06 DIAGNOSIS — E785 Hyperlipidemia, unspecified: Secondary | ICD-10-CM | POA: Diagnosis not present

## 2019-07-06 DIAGNOSIS — E6609 Other obesity due to excess calories: Secondary | ICD-10-CM | POA: Diagnosis not present

## 2019-07-06 DIAGNOSIS — I10 Essential (primary) hypertension: Secondary | ICD-10-CM | POA: Diagnosis not present

## 2019-07-06 DIAGNOSIS — Z01419 Encounter for gynecological examination (general) (routine) without abnormal findings: Secondary | ICD-10-CM | POA: Diagnosis not present

## 2019-07-06 DIAGNOSIS — Z1231 Encounter for screening mammogram for malignant neoplasm of breast: Secondary | ICD-10-CM | POA: Diagnosis not present

## 2019-07-06 DIAGNOSIS — E1169 Type 2 diabetes mellitus with other specified complication: Secondary | ICD-10-CM | POA: Diagnosis not present

## 2019-07-07 DIAGNOSIS — N644 Mastodynia: Secondary | ICD-10-CM | POA: Diagnosis not present

## 2019-07-09 ENCOUNTER — Other Ambulatory Visit: Payer: Federal, State, Local not specified - PPO | Admitting: Orthotics

## 2019-07-09 ENCOUNTER — Other Ambulatory Visit: Payer: Self-pay

## 2019-07-11 ENCOUNTER — Other Ambulatory Visit: Payer: Self-pay | Admitting: Obstetrics and Gynecology

## 2019-07-11 DIAGNOSIS — N6320 Unspecified lump in the left breast, unspecified quadrant: Secondary | ICD-10-CM

## 2019-07-19 ENCOUNTER — Other Ambulatory Visit: Payer: Federal, State, Local not specified - PPO

## 2019-07-26 ENCOUNTER — Ambulatory Visit
Admission: RE | Admit: 2019-07-26 | Discharge: 2019-07-26 | Disposition: A | Discharge status: Home / Self Care | Payer: Federal, State, Local not specified - PPO | Source: Ambulatory Visit | Attending: Obstetrics and Gynecology | Admitting: Obstetrics and Gynecology

## 2019-07-26 ENCOUNTER — Other Ambulatory Visit: Payer: Self-pay

## 2019-07-26 ENCOUNTER — Ambulatory Visit: Payer: Federal, State, Local not specified - PPO | Admitting: Podiatry

## 2019-07-26 DIAGNOSIS — N6320 Unspecified lump in the left breast, unspecified quadrant: Secondary | ICD-10-CM

## 2019-07-26 DIAGNOSIS — R922 Inconclusive mammogram: Secondary | ICD-10-CM | POA: Diagnosis not present

## 2019-07-26 DIAGNOSIS — N6489 Other specified disorders of breast: Secondary | ICD-10-CM | POA: Diagnosis not present

## 2019-09-27 ENCOUNTER — Other Ambulatory Visit: Payer: Self-pay

## 2019-09-27 ENCOUNTER — Ambulatory Visit: Payer: Self-pay | Admitting: Registered Nurse

## 2019-09-27 ENCOUNTER — Encounter: Payer: Self-pay | Admitting: Registered Nurse

## 2019-09-27 VITALS — BP 104/71 | HR 72 | Temp 97.4°F

## 2019-09-27 DIAGNOSIS — M654 Radial styloid tenosynovitis [de Quervain]: Secondary | ICD-10-CM

## 2019-09-27 MED ORDER — IBUPROFEN 800 MG PO TABS: 400.0000 mg | ORAL_TABLET | Freq: Three times a day (TID) | ORAL | 0 refills | Status: AC | PRN

## 2019-09-27 NOTE — Patient Instructions (Signed)
Trigger Finger  Trigger finger (stenosing tenosynovitis) is a condition that causes a finger to get stuck in a bent position. Each finger has a tough, cord-like tissue that connects muscle to bone (tendon), and each tendon is surrounded by a tunnel of tissue (tendon sheath). To move your finger, your tendon needs to slide freely through the sheath. Trigger finger happens when the tendon or the sheath thickens, making it difficult to move your finger. Trigger finger can affect any finger or a thumb. It may affect more than one finger. Mild cases may clear up with rest and medicine. Severe cases require more treatment. What are the causes? Trigger finger is caused by a thickened finger tendon or tendon sheath. The cause of this thickening is not known. What increases the risk? The following factors may make you more likely to develop this condition:  Doing activities that require a strong grip.  Having rheumatoid arthritis, gout, or diabetes.  Being 40-60 years old.  Being a woman. What are the signs or symptoms? Symptoms of this condition include:  Pain when bending or straightening your finger.  Tenderness or swelling where your finger attaches to the palm of your hand.  A lump in the palm of your hand or on the inside of your finger.  Hearing a popping sound when you try to straighten your finger.  Feeling a popping, catching, or locking sensation when you try to straighten your finger.  Being unable to straighten your finger. How is this diagnosed? This condition is diagnosed based on your symptoms and a physical exam. How is this treated? This condition may be treated by:  Resting your finger and avoiding activities that make symptoms worse.  Wearing a finger splint to keep your finger in a slightly bent position.  Taking NSAIDs to relieve pain and swelling.  Injecting medicine (steroids) into the tendon sheath to reduce swelling and irritation. Injections may need to be  repeated.  Having surgery to open the tendon sheath. This may be done if other treatments do not work and you cannot straighten your finger. You may need physical therapy after surgery. Follow these instructions at home:   Use moist heat to help reduce pain and swelling as told by your health care provider.  Rest your finger and avoid activities that make pain worse. Return to normal activities as told by your health care provider.  If you have a splint, wear it as told by your health care provider.  Take over-the-counter and prescription medicines only as told by your health care provider.  Keep all follow-up visits as told by your health care provider. This is important. Contact a health care provider if:  Your symptoms are not improving with home care. Summary  Trigger finger (stenosing tenosynovitis) causes your finger to get stuck in a bent position, and it can make it difficult and painful to straighten your finger.  This condition develops when a finger tendon or tendon sheath thickens.  Treatment starts with resting, wearing a splint, and taking NSAIDs.  In severe cases, surgery to open the tendon sheath may be needed. This information is not intended to replace advice given to you by your health care provider. Make sure you discuss any questions you have with your health care provider. Document Released: 08/21/2004 Document Revised: 10/14/2017 Document Reviewed: 10/12/2016 Elsevier Patient Education  2020 [employer]  

## 2019-09-27 NOTE — Progress Notes (Signed)
Subjective:    Patient ID: Regina Rodriguez, female    DOB: [birth year], 58 y.o.   MRN: [id number]  58y/o African-American established female pt c/o R thumb/CMC joint pain. Has been working with flatware for past 3 days. 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 Repetitive motion with this, gripping cutters and squeezing inward. Noticed pain and reduced grip strength yesterday. Feels like joint is popping. Couldn't open her beverage bottle with right hand the past week having to use left hand and started using wire cutters in her left hand when right hand got sore.  Denied rash/increased temperature/swelling.  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  Right hand dominant     Review of Systems  Constitutional: Negative for activity change, appetite change, chills, diaphoresis, fatigue and fever.  HENT: Negative for trouble swallowing and voice change.   Eyes: Negative for photophobia and visual disturbance.  Respiratory: Negative for cough, shortness of breath, wheezing and stridor.   Gastrointestinal: Negative for diarrhea, nausea and vomiting.  Endocrine: Negative for cold intolerance and heat intolerance.  Genitourinary: Negative for difficulty urinating.  Musculoskeletal: Positive for joint swelling and myalgias. Negative for back pain, gait problem, neck pain and neck stiffness.  Allergic/Immunologic: Negative for food allergies.  Neurological: Positive for weakness. Negative for tremors, seizures, syncope, speech difficulty and numbness.  Hematological: Negative for adenopathy. Does not bruise/bleed easily.  Psychiatric/Behavioral: Negative for agitation, confusion and sleep disturbance.       Objective:   Physical Exam Vitals signs and nursing note reviewed.  Constitutional:      General: She is awake. She is not in acute distress.  Appearance: Normal appearance. She is well-developed and well-groomed. She is not ill-appearing, toxic-appearing or diaphoretic.  HENT:     Head: Normocephalic and atraumatic.     Jaw: There is normal jaw occlusion.     Right Ear: Hearing and external ear normal.     Left Ear: Hearing and external ear normal.     Nose: Nose normal.     Mouth/Throat:     Mouth: Mucous membranes are moist.     Pharynx: Oropharynx is clear.  Eyes:     General: Lids are normal. Vision grossly intact. Gaze aligned appropriately. No visual field deficit.    Extraocular Movements: Extraocular movements intact.     Conjunctiva/sclera: Conjunctivae normal.     Pupils: Pupils are equal, round, and reactive to light.  Neck:     Musculoskeletal: Normal range of motion and neck supple.     Trachea: Trachea and phonation normal.  Cardiovascular:     Rate and Rhythm: Normal rate and regular rhythm.     Pulses: Normal pulses.          Radial pulses are 2+ on the right side and 2+ on the left side.     Heart sounds: Normal heart sounds.  Pulmonary:     Effort: Pulmonary effort is normal.     Breath sounds: Normal breath sounds and air entry. No stridor, decreased air movement or transmitted upper airway sounds. No decreased breath sounds, wheezing, rhonchi or rales.     Comments: No cough observed in exam room; spoke full sentences without difficulty; wearing cloth mask due to covid 19 pandemic Abdominal:     Palpations: Abdomen is soft.  Musculoskeletal:        General: Tenderness present.  Right shoulder: Normal.     Left shoulder: Normal.     Right elbow: Normal.    Left elbow: Normal.     Right wrist: Normal.     Left wrist: Normal.     Right hip: Normal.     Left hip: Normal.     Right knee: Normal.     Left knee: Normal.     Cervical back: Normal.     Thoracic back: Normal.     Lumbar back: Normal.     Right forearm: Normal.     Left forearm: Normal.     Right hand: She exhibits decreased range  of motion and tenderness. She exhibits no bony tenderness, normal capillary refill, no laceration and no swelling. Normal sensation noted. Decreased strength noted. She exhibits finger abduction and thumb/finger opposition. She exhibits no wrist extension trouble.     Left hand: Normal.       Hands:     Comments: Pain with making fist right hand especially thumb area  Lymphadenopathy:     Head:     Right side of head: No submental or submandibular adenopathy.     Left side of head: No submental or submandibular adenopathy.     Cervical: No cervical adenopathy.     Right cervical: No superficial cervical adenopathy.    Left cervical: No superficial cervical adenopathy.  Skin:    General: Skin is warm and dry.     Capillary Refill: Capillary refill takes less than 2 seconds.     Coloration: Skin is not ashen, cyanotic, jaundiced, mottled, pale or sallow.     Findings: No abrasion, abscess, acne, bruising, burn, ecchymosis, erythema, signs of injury, laceration, lesion, petechiae, rash or wound.     Nails: There is no clubbing.   Neurological:     General: No focal deficit present.     Mental Status: She is alert and oriented to person, place, and time. Mental status is at baseline.     GCS: GCS eye subscore is 4. GCS verbal subscore is 5. GCS motor subscore is 6.     Cranial Nerves: Cranial nerves are intact. No cranial nerve deficit, dysarthria or facial asymmetry.     Sensory: Sensation is intact. No sensory deficit.     Motor: Weakness present. No tremor, atrophy, abnormal muscle tone or seizure activity.     Coordination: Coordination is intact. Coordination normal.     Gait: Gait is intact. Gait normal.     Comments: Gait sure and steady in hallway; in/out of chair without difficulty; right hand grasp weaker 4/5 compared to left 5/5; upper and lower extremity strength equal 5/5  Psychiatric:        Attention and Perception: Attention and perception normal.        Mood and Affect:  Mood and affect normal.        Speech: Speech normal.        Behavior: Behavior normal. Behavior is cooperative.        Thought Content: Thought content normal.        Cognition and Memory: Cognition and memory normal.        Judgment: Judgment normal.     Fitted and distributed right wrist thumb spica splint size small from clinic stock      Assessment & Plan:  A-right trigger finger thumb initial visit  P-Wear thumb spica right splint all times except to shower and allow skin to dry during rest period if sweating in splint  to prevent rash.  May use hair dryer to dry wet splint.  Do not put in washer/dryer.  Do not tighten straps so tight it cuts off circulation/fingers turn blue/cold but straps should feel like a hug and splint should not slide around on hand/arm when wearing.  Dispensed motrin  po TID prn pain (1/2 tab ) #30 RF0 from [facility] to patient.  Follow up in 2 weeks for re-evaluation.  Exitcare handout printed and given to patient on trigger finger.  May apply ice 15 minutes QID prn pain/swelling.  Discussed if no improvement with conservative care will consider orthopedics referral for local injection steroid to sheath.  Take motrin 30 minutes after ozempic  And with food to help avoid gastric irritation.  Patient verbalized understanding information/instructions, agreed with plan of care and had no further questions at this time.

## 2019-10-10 DIAGNOSIS — M65311 Trigger thumb, right thumb: Secondary | ICD-10-CM | POA: Diagnosis not present

## 2019-10-14 IMAGING — MG MM DIGITAL DIAGNOSTIC UNILAT*L* W/ TOMO W/ CAD
6 series · 6 of 18 positions shown · non-contrast
Comparison: Screening mammogram July 06, 2019 and earlier priors

CLINICAL DATA: 58-year-old patient with recent pain/tenderness in
the outer left breast, that is improving over time. She has been
doing yard work recently and thinks it may be related to that.

EXAM:
DIGITAL DIAGNOSTIC LEFT MAMMOGRAM WITH CAD AND TOMO
ULTRASOUND LEFT BREAST

[L CC synth-2D]
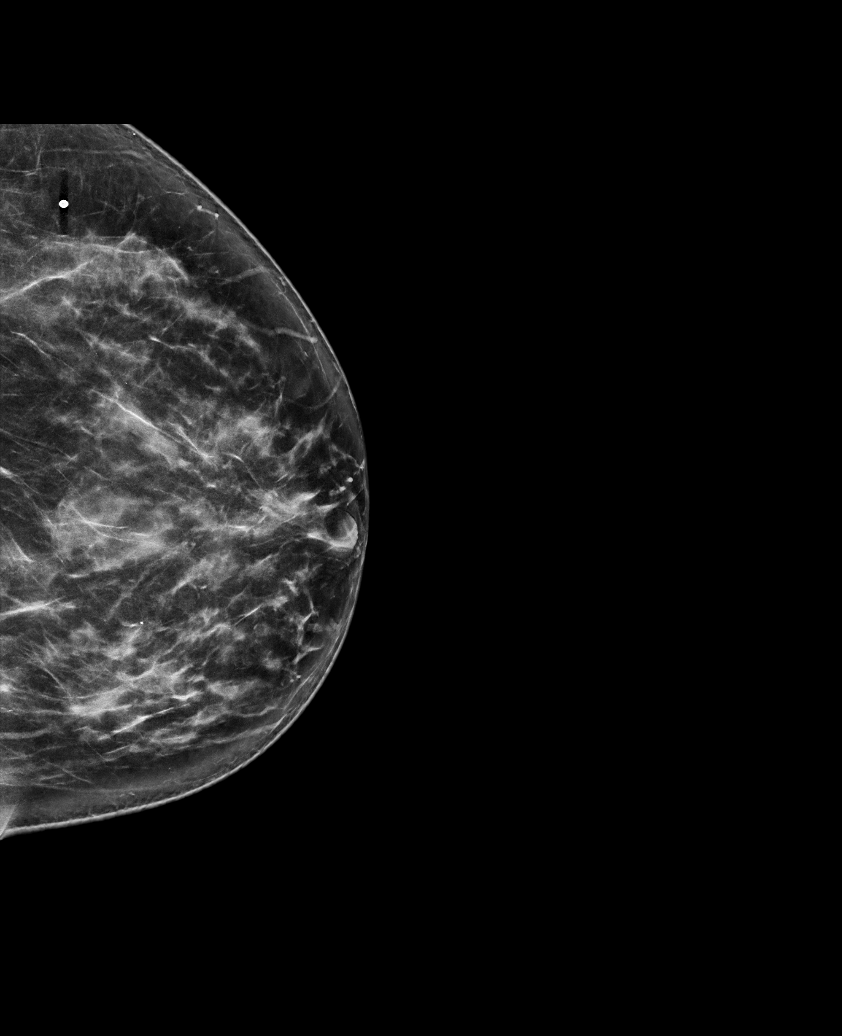

[L TAN synth-2D]
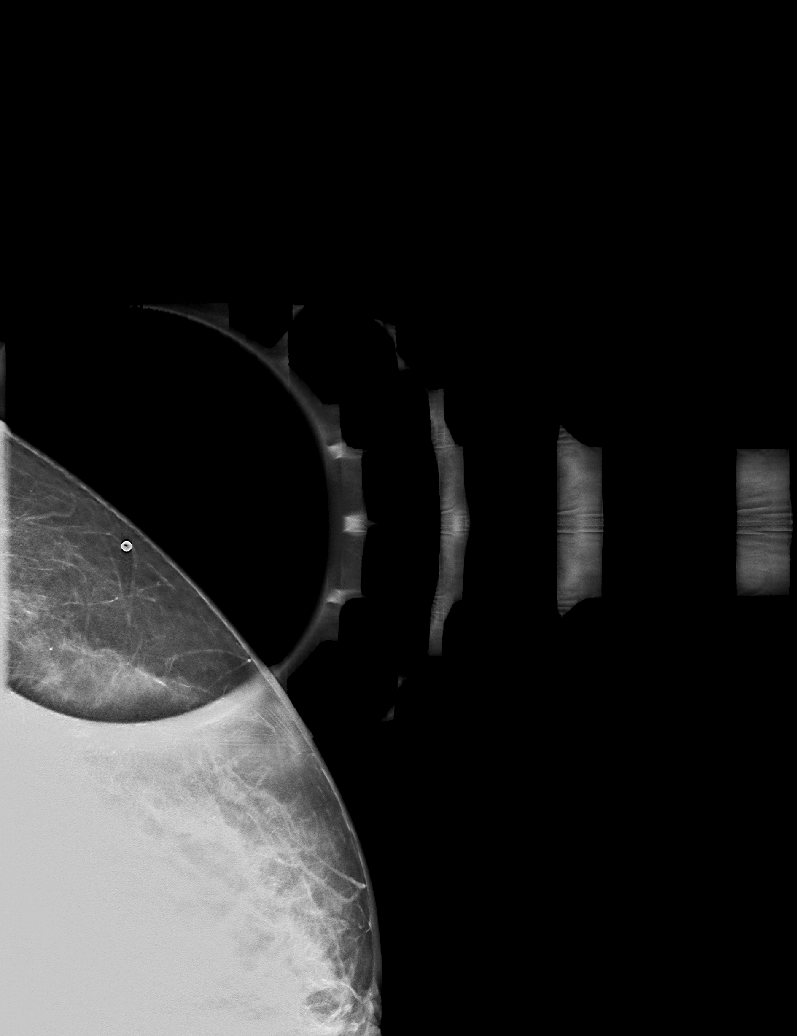

[L MLO synth-2D]
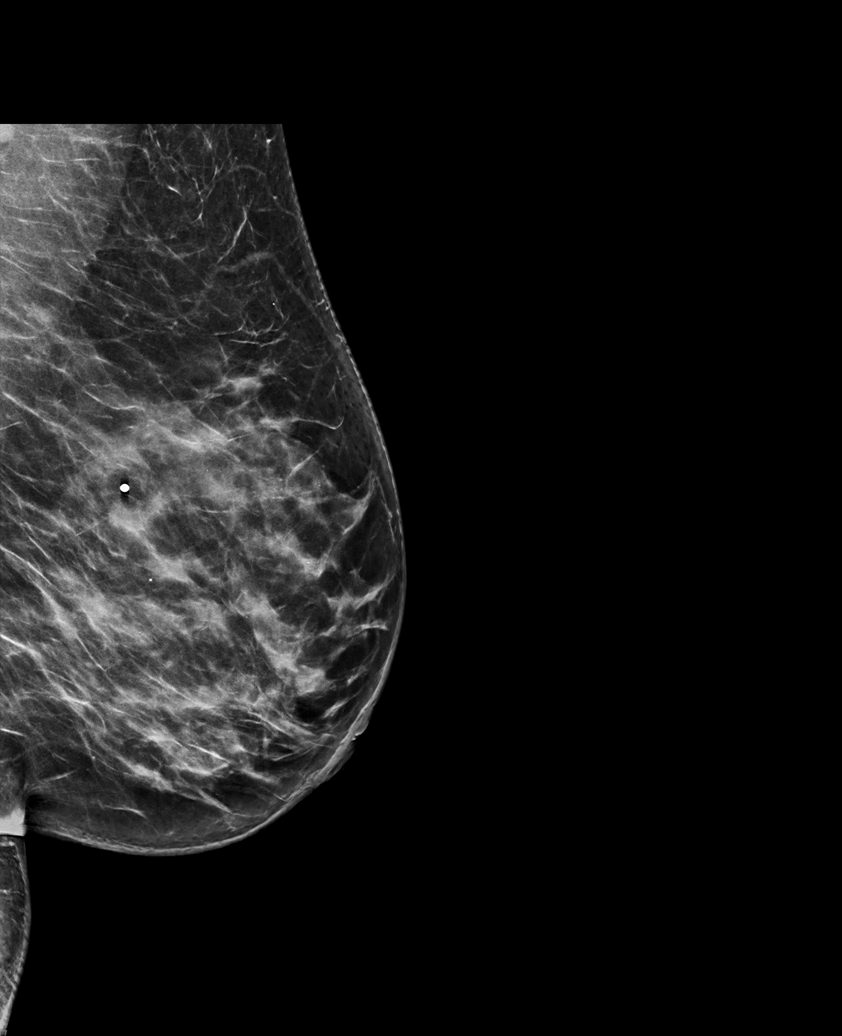

[L CC tomo · tomo slice 39/77.0]
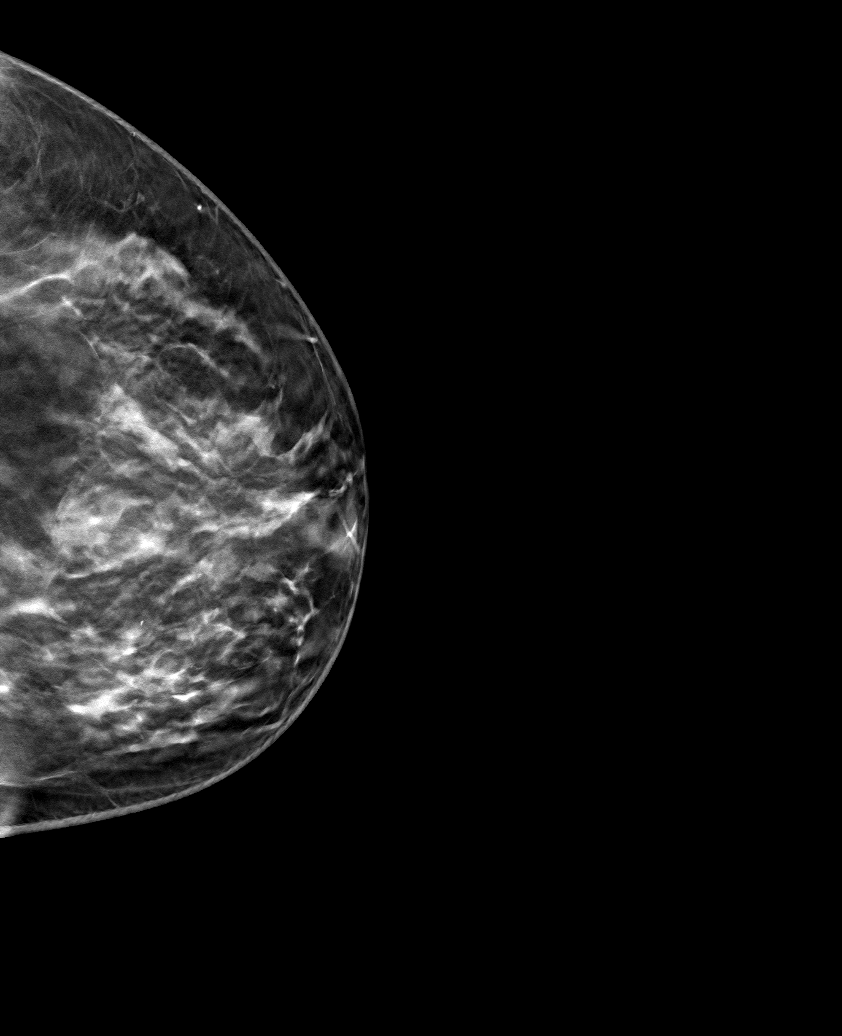

[L TAN tomo · tomo slice 25/49.0]
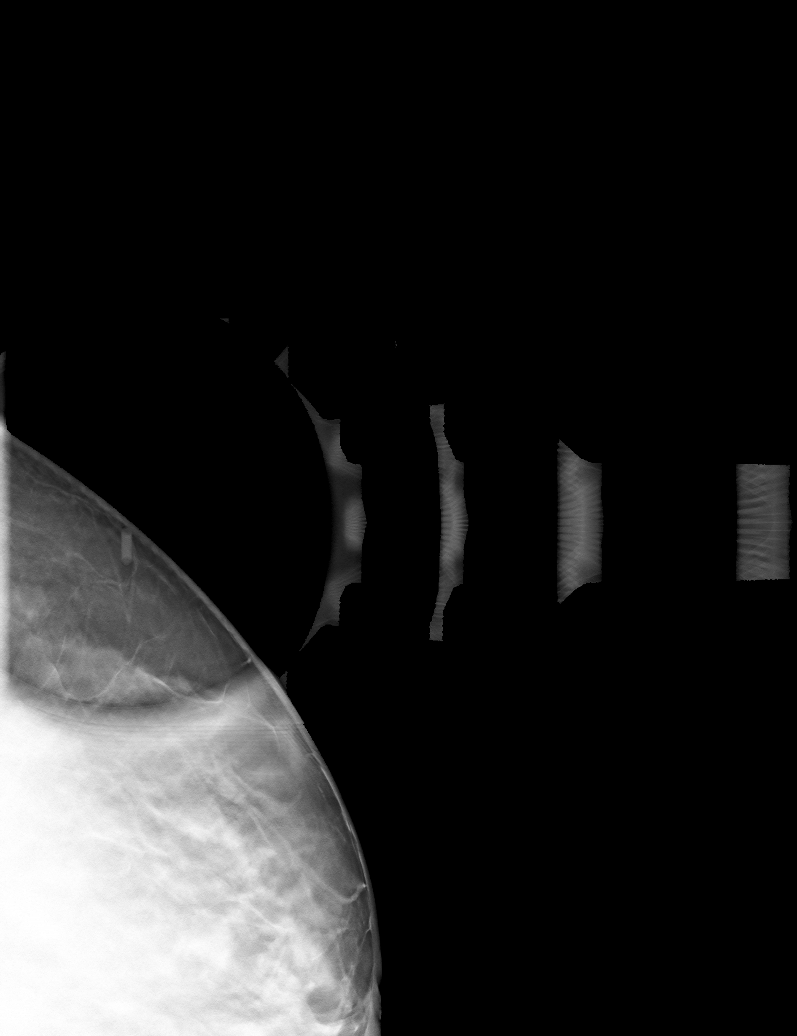

[L MLO tomo · tomo slice 41/80.0]
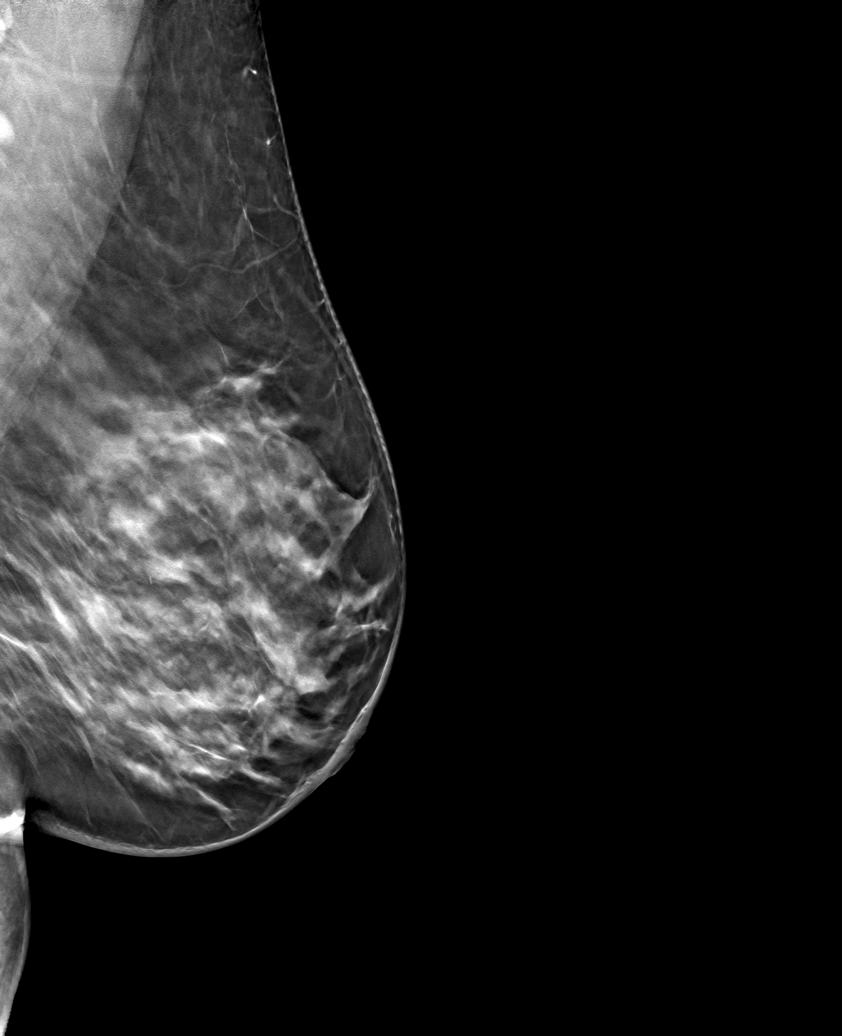

[6 of 18 positions shown; findings below may reference images not displayed]

ACR Breast Density Category c: The breast tissue is heterogeneously
dense, which may obscure small masses.
FINDINGS: No mass, architectural distortion, or suspicious microcalcification
is identified to suggest malignancy in the left breast. Spot
tangential view of the outer left breast in the region of recent
pain is negative.

Mammographic images were processed with CAD.

Targeted ultrasound is performed, showing normal fibroglandular
tissue in the outer left breast in the region of recent concern. No
suspicious findings on ultrasound.
IMPRESSION: No evidence of malignancy in the left breast.

RECOMMENDATION:
Bilateral screening mammogram recommend June 2020.

I have discussed the findings and recommendations with the patient.
If applicable, a reminder letter will be sent to the patient
regarding the next appointment.

BI-RADS CATEGORY  1: Negative.

## 2019-11-07 DIAGNOSIS — M65311 Trigger thumb, right thumb: Secondary | ICD-10-CM | POA: Diagnosis not present

## 2019-11-29 DIAGNOSIS — M65311 Trigger thumb, right thumb: Secondary | ICD-10-CM | POA: Diagnosis not present

## 2019-12-06 DIAGNOSIS — M130 Polyarthritis, unspecified: Secondary | ICD-10-CM | POA: Diagnosis not present

## 2019-12-06 DIAGNOSIS — E1169 Type 2 diabetes mellitus with other specified complication: Secondary | ICD-10-CM | POA: Diagnosis not present

## 2019-12-06 DIAGNOSIS — I10 Essential (primary) hypertension: Secondary | ICD-10-CM | POA: Diagnosis not present

## 2019-12-06 DIAGNOSIS — E785 Hyperlipidemia, unspecified: Secondary | ICD-10-CM | POA: Diagnosis not present

## 2019-12-07 DIAGNOSIS — E1169 Type 2 diabetes mellitus with other specified complication: Secondary | ICD-10-CM | POA: Diagnosis not present

## 2019-12-07 DIAGNOSIS — I10 Essential (primary) hypertension: Secondary | ICD-10-CM | POA: Diagnosis not present

## 2019-12-26 DIAGNOSIS — M65311 Trigger thumb, right thumb: Secondary | ICD-10-CM | POA: Diagnosis not present

## 2020-04-17 ENCOUNTER — Encounter: Payer: Self-pay | Admitting: Registered Nurse

## 2020-04-17 ENCOUNTER — Other Ambulatory Visit: Payer: Self-pay

## 2020-04-17 ENCOUNTER — Ambulatory Visit: Payer: Self-pay | Admitting: Registered Nurse

## 2020-04-17 VITALS — BP 115/61 | HR 85 | Temp 97.4°F

## 2020-04-17 DIAGNOSIS — H9201 Otalgia, right ear: Secondary | ICD-10-CM

## 2020-04-17 DIAGNOSIS — L03211 Cellulitis of face: Secondary | ICD-10-CM

## 2020-04-17 DIAGNOSIS — R590 Localized enlarged lymph nodes: Secondary | ICD-10-CM

## 2020-04-17 MED ORDER — CEPHALEXIN 500 MG PO CAPS: 500.0000 mg | ORAL_CAPSULE | Freq: Two times a day (BID) | ORAL | 0 refills | Status: AC

## 2020-04-17 NOTE — Progress Notes (Signed)
Subjective:    Patient ID: Regina Rodriguez, female    DOB: March 03, 1961, 59 y.o.   MRN: [id number]  59y/o African American established female pt c/o R ear pain after weekend at the beach.  Wind in ear hurt, put olive oil and cotton ball in helped some but then noticed lymph node behind right ear swollen also.  Was wearing mask when at beach and she did notice was irritating skin side of her face also.  Denied headache, dysphagia/dysphasia, ear discharge, n/v/d, dyspnea or fever/chills.  Noted some congestion and post nasal drip.     Review of Systems  Constitutional: Positive for activity change. Negative for appetite change, chills, diaphoresis, fatigue and fever.  HENT: Positive for ear pain and postnasal drip. Negative for ear discharge, facial swelling, hearing loss, mouth sores, nosebleeds, rhinorrhea, sinus pressure, sinus pain, sneezing, sore throat, tinnitus, trouble swallowing and voice change.   Eyes: Negative for photophobia, pain, discharge, redness, itching and visual disturbance.  Respiratory: Negative for cough, shortness of breath, wheezing and stridor.   Cardiovascular: Negative for chest pain.  Gastrointestinal: Negative for diarrhea, nausea and vomiting.  Endocrine: Negative for cold intolerance and heat intolerance.  Genitourinary: Negative for difficulty urinating.  Musculoskeletal: Negative for gait problem, neck pain and neck stiffness.  Skin: Positive for rash. Negative for color change.  Allergic/Immunologic: Negative for food allergies.  Neurological: Negative for dizziness, tremors, seizures, syncope, facial asymmetry, speech difficulty, light-headedness, numbness and headaches.  Hematological: Positive for adenopathy. Does not bruise/bleed easily.  Psychiatric/Behavioral: Negative for agitation, confusion and sleep disturbance.       Objective:   Physical Exam Vitals and nursing note reviewed.  Constitutional:      General: She is awake. She is not in acute  distress.    Appearance: Normal appearance. She is well-developed, well-groomed and overweight. She is not ill-appearing, toxic-appearing or diaphoretic.  HENT:     Head: Normocephalic and atraumatic.     Jaw: There is normal jaw occlusion. No trismus, tenderness, swelling, pain on movement or malocclusion.     Salivary Glands: Right salivary gland is not diffusely enlarged or tender. Left salivary gland is not diffusely enlarged or tender.     Right Ear: Hearing, ear canal and external ear normal. A middle ear effusion is present. There is no impacted cerumen.     Left Ear: Hearing, ear canal and external ear normal. A middle ear effusion is present. There is no impacted cerumen.     Nose: Mucosal edema and rhinorrhea present. No nasal deformity, septal deviation, signs of injury, laceration, nasal tenderness or congestion.     Right Sinus: No maxillary sinus tenderness or frontal sinus tenderness.     Left Sinus: No maxillary sinus tenderness or frontal sinus tenderness.     Mouth/Throat:     Lips: Pink. No lesions.     Mouth: Mucous membranes are moist. Mucous membranes are not pale, not dry and not cyanotic. No injury, lacerations, oral lesions or angioedema.     Dentition: No dental tenderness, gingival swelling, dental abscesses or gum lesions.     Tongue: No lesions. Tongue does not deviate from midline.     Palate: No mass and lesions.     Pharynx: Uvula midline. Pharyngeal swelling and posterior oropharyngeal erythema present. No oropharyngeal exudate or uvula swelling.     Tonsils: No tonsillar exudate or tonsillar abscesses. 0 on the right. 0 on the left.     Comments: Cobblestoning posterior pharynx; bilateral allergic shiners;  bilateral TMs air fluid level clear;  Eyes:     General: Lids are normal. Vision grossly intact. Gaze aligned appropriately. Allergic shiner present. No visual field deficit or scleral icterus.       Right eye: No foreign body, discharge or hordeolum.         Left eye: No foreign body, discharge or hordeolum.     Extraocular Movements: Extraocular movements intact.     Right eye: Normal extraocular motion and no nystagmus.     Left eye: Normal extraocular motion and no nystagmus.     Conjunctiva/sclera: Conjunctivae normal.     Right eye: Right conjunctiva is not injected. No chemosis, exudate or hemorrhage.    Left eye: Left conjunctiva is not injected. No chemosis, exudate or hemorrhage.    Pupils: Pupils are equal, round, and reactive to light. Pupils are equal.     Right eye: Pupil is round and reactive.     Left eye: Pupil is round and reactive.  Neck:     Thyroid: No thyromegaly.     Trachea: Trachea and phonation normal. No tracheal tenderness or tracheal deviation.  Cardiovascular:     Rate and Rhythm: Normal rate and regular rhythm.     Chest Wall: PMI is not displaced.     Pulses: Normal pulses.          Radial pulses are 2+ on the right side and 2+ on the left side.     Heart sounds: Normal heart sounds, S1 normal and S2 normal. No murmur. No friction rub. No gallop.   Pulmonary:     Effort: Pulmonary effort is normal. No accessory muscle usage or respiratory distress.     Breath sounds: Normal breath sounds and air entry. No stridor, decreased air movement or transmitted upper airway sounds. No decreased breath sounds, wheezing, rhonchi or rales.     Comments: No cough observed in clinic; spoke full sentences without difficulty Chest:     Chest wall: No tenderness.  Abdominal:     General: Abdomen is flat. There is no distension.     Palpations: Abdomen is soft.  Musculoskeletal:        General: No swelling or signs of injury. Normal range of motion.     Right shoulder: Normal.     Left shoulder: Normal.     Right elbow: Normal.     Left elbow: Normal.     Right hand: Normal.     Left hand: Normal.     Cervical back: Normal, normal range of motion and neck supple. No swelling, edema, deformity, erythema, signs of trauma,  lacerations, rigidity, torticollis or crepitus. No pain with movement. Normal range of motion.     Thoracic back: Normal.     Lumbar back: Normal.     Right hip: Normal.     Left hip: Normal.  Lymphadenopathy:     Head:     Right side of head: Posterior auricular adenopathy present. No submental, submandibular, tonsillar, preauricular or occipital adenopathy.     Left side of head: No submental, submandibular, tonsillar, preauricular, posterior auricular or occipital adenopathy.     Cervical: Cervical adenopathy present.     Right cervical: Posterior cervical adenopathy present. No superficial or deep cervical adenopathy.    Left cervical: No superficial, deep or posterior cervical adenopathy.     Comments: 1cm nonmobile TTP no fluctuance no erythema or increased temperature post auricular and post cervical proximal lymph node right  Skin:  General: Skin is warm and dry.     Capillary Refill: Capillary refill takes less than 2 seconds.     Coloration: Skin is not ashen, cyanotic, jaundiced, mottled, pale or sallow.     Findings: Abrasion and rash present. No abscess, acne, bruising, burn, ecchymosis, erythema, laceration, lesion, petechiae or wound. Rash is papular. Rash is not crusting, macular, nodular, purpuric, pustular, scaling, urticarial or vesicular.     Nails: There is no clubbing.          Comments: Grouped linear pattern 1cm preauricular right papules slightly TTP with abraded tips fine to no scab 15 papules  Neurological:     General: No focal deficit present.     Mental Status: She is alert and oriented to person, place, and time. Mental status is at baseline. She is not disoriented.     GCS: GCS eye subscore is 4. GCS verbal subscore is 5. GCS motor subscore is 6.     Cranial Nerves: Cranial nerves are intact. No cranial nerve deficit, dysarthria or facial asymmetry.     Sensory: Sensation is intact. No sensory deficit.     Motor: Motor function is intact. No weakness,  tremor, atrophy, abnormal muscle tone or seizure activity.     Coordination: Coordination is intact. Coordination normal.     Gait: Gait is intact. Gait normal.     Comments: Gait sure and steady in hallway; in/out of chair and on/off exam table without difficulty; bilateral hand grasp equal 5/5  Psychiatric:        Attention and Perception: Attention and perception normal.        Mood and Affect: Mood and affect normal.        Speech: Speech normal.        Behavior: Behavior normal. Behavior is cooperative.        Thought Content: Thought content normal.        Cognition and Memory: Cognition and memory normal.        Judgment: Judgment normal.           Assessment & Plan:  A-acute right lymphadenopathy, abrasion and cellulitis facial acute initial visit and right ear pain acute  P- Enlarged lymph node probable due to adjacent cellulitis should resolve with treatment of cellulitis but continue to monitor over the next 6 weeks and if does not resolve completely follow up for re-evaluation.  Exitcare handout on lymphadenopathy printed and given to patient.  Patient verbalized understanding information/instructions, agreed with plan of care and had no further questions at this time.  Dispensed from PDRx keflex 500mg  po BID x 7 days #30 RF0 to patient.  May apply warm packs to affected area to increase circulation to help resolve infection sooner.  If swelling worsening apply ice cryotherapy 15 minutes QID.  Exitcare handout on skin infection printed and given to patient. RTC if worsening erythema, pain, purulent discharge, fever. Wash towels, washcloths, sheets in hot water with bleach every couple of days until infection resolved. Wash area with soap and water at least daily.  Apply triple antibiotic or aquaphor daily to BID.   Patient verbalized understanding, agreed with plan of care and had no further questions at this time.   No evidence of invasive bacterial infection, non toxic and  well hydrated.  I do not see where any further testing or imaging is necessary at this time.   I will suggest supportive care, rest, good hygiene and encourage the patient to take adequate fluids.  The  patient is to return to clinic or EMERGENCY ROOM if symptoms worsen or change significantly e.g. ear pain, fever, purulent discharge from ears or bleeding.  Exitcare handout on eustachian tube dysfunction printed and given to patient.  Discussed with patient post nasal drip irritates throat/causes swelling blocks eustachian tubes from draining and fluid fills up middle ear.  Bacteria/viruses can grow in fluid and with moving head tube compressed and increases pressure in tube/ear worsening pain.  Studies show will take 30 days for fluid to resolve after post nasal drip controlled with nasal steroid/antihistamine. Antibiotics and steroids do not speed up fluid removal.  Patient verbalized agreement and understanding of treatment plan and had no further questions at this time.

## 2020-04-17 NOTE — Patient Instructions (Signed)
Eustachian Tube Dysfunction  Eustachian tube dysfunction refers to a condition in which a blockage develops in the narrow passage that connects the middle ear to the back of the nose (eustachian tube). The eustachian tube regulates air pressure in the middle ear by letting air move between the ear and nose. It also helps to drain fluid from the middle ear space. Eustachian tube dysfunction can affect one or both ears. When the eustachian tube does not function properly, air pressure, fluid, or both can build up in the middle ear. What are the causes? This condition occurs when the eustachian tube becomes blocked or cannot open normally. Common causes of this condition include:  Ear infections.  Colds and other infections that affect the nose, mouth, and throat (upper respiratory tract).  Allergies.  Irritation from cigarette smoke.  Irritation from stomach acid coming up into the esophagus (gastroesophageal reflux). The esophagus is the tube that carries food from the mouth to the stomach.  Sudden changes in air pressure, such as from descending in an airplane or scuba diving.  Abnormal growths in the nose or throat, such as: ? Growths that line the nose (nasal polyps). ? Abnormal growth of cells (tumors). ? Enlarged tissue at the back of the throat (adenoids). What increases the risk? You are more likely to develop this condition if:  You smoke.  You are overweight.  You are a child who has: ? Certain birth defects of the mouth, such as cleft palate. ? Large tonsils or adenoids. What are the signs or symptoms? Common symptoms of this condition include:  A feeling of fullness in the ear.  Ear pain.  Clicking or popping noises in the ear.  Ringing in the ear.  Hearing loss.  Loss of balance.  Dizziness. Symptoms may get worse when the air pressure around you changes, such as when you travel to an area of high elevation, fly on an airplane, or go scuba diving. How is  this diagnosed? This condition may be diagnosed based on:  Your symptoms.  A physical exam of your ears, nose, and throat.  Tests, such as those that measure: ? The movement of your eardrum (tympanogram). ? Your hearing (audiometry). How is this treated? Treatment depends on the cause and severity of your condition.  In mild cases, you may relieve your symptoms by moving air into your ears. This is called "popping the ears."  In more severe cases, or if you have symptoms of fluid in your ears, treatment may include: ? Medicines to relieve congestion (decongestants). ? Medicines that treat allergies (antihistamines). ? Nasal sprays or ear drops that contain medicines that reduce swelling (steroids). ? A procedure to drain the fluid in your eardrum (myringotomy). In this procedure, a small tube is placed in the eardrum to:  Drain the fluid.  Restore the air in the middle ear space. ? A procedure to insert a balloon device through the nose to inflate the opening of the eustachian tube (balloon dilation). Follow these instructions at home: Lifestyle  Do not do any of the following until your health care provider approves: ? Travel to high altitudes. ? Fly in airplanes. ? Work in a pressurized cabin or room. ? Scuba dive.  Do not use any products that contain nicotine or tobacco, such as cigarettes and e-cigarettes. If you need help quitting, ask your health care provider.  Keep your ears dry. Wear fitted earplugs during showering and bathing. Dry your ears completely after. General instructions  Take over-the-counter   and prescription medicines only as told by your health care provider.  Use techniques to help pop your ears as recommended by your health care provider. These may include: ? Chewing gum. ? Yawning. ? Frequent, forceful swallowing. ? Closing your mouth, holding your nose closed, and gently blowing as if you are trying to blow air out of your nose.  Keep all  follow-up visits as told by your health care provider. This is important. Contact a health care provider if:  Your symptoms do not go away after treatment.  Your symptoms come back after treatment.  You are unable to pop your ears.  You have: ? A fever. ? Pain in your ear. ? Pain in your head or neck. ? Fluid draining from your ear.  Your hearing suddenly changes.  You become very dizzy.  You lose your balance. Summary  Eustachian tube dysfunction refers to a condition in which a blockage develops in the eustachian tube.  It can be caused by ear infections, allergies, inhaled irritants, or abnormal growths in the nose or throat.  Symptoms include ear pain, hearing loss, or ringing in the ears.  Mild cases are treated with maneuvers to unblock the ears, such as yawning or ear popping.  Severe cases are treated with medicines. Surgery may also be done (rare). This information is not intended to replace advice given to you by your health care provider. Make sure you discuss any questions you have with your health care provider. Document Revised: 02/21/2018 Document Reviewed: 02/21/2018 Elsevier Patient Education  [place]. Abrasion  An abrasion is a cut or a scrape on the outer surface of the skin. An abrasion does not go through all the layers of the skin. It is important to care for your abrasion properly to prevent infection. What are the causes? This condition is caused by falling on or gliding across the ground or another surface. When your skin rubs on something, the outer and inner layers of skin may rub off. What are the signs or symptoms? The main symptom of this condition is a cut or a scrape. The scrape may be bleeding, or it may appear red or pink. If the abrasion was caused by a fall, there may be a bruise under the cut or scrape. How is this diagnosed? An abrasion is diagnosed with a physical exam. How is this treated? Treatment for this condition  depends on how large and deep the abrasion is. In most cases:  Your abrasion will be cleaned with water and mild soap. This is done to remove any dirt or debris (such as particles of glass or rock) that may be stuck in the wound.  An antibiotic ointment may be applied to the abrasion to help prevent infection.  A bandage (dressing) may be placed on the abrasion to keep it clean. You may also need a tetanus shot. Follow these instructions at home: Medicines  Take or apply over-the-counter and prescription medicines only as told by your health care provider.  If you were prescribed an antibiotic medicine, apply it as told by your health care provider. Wound care  Clean the wound 2-3 times a day, or as directed by your health care provider. To do this, wash the wound with mild soap and water, rinse off the soap, and pat the wound dry with a clean towel. Do not rub the wound.  Keep the dressing clean and dry as told by your health care provider.  There are many different ways to  close and cover a wound. Follow instructions from your health care provider about: ? Caring for your wound. ? Changing and removing your dressing. You may have to change your dressing one or more times a day, or as directed by your health care provider.  Check your wound every day for signs of infection. Check for: ? Redness, particularly a red streak that spreads out from the wound. ? Swelling or increased pain. ? Warmth. ? Fluid, pus, or a bad smell.  If directed, put ice on the injured area to reduce pain and swelling: ? Put ice in a plastic bag. ? Place a towel between your skin and the bag. ? Leave the ice on for 20 minutes, 2-3 times a day. General instructions  Do not take baths, swim, or use a hot tub until your health care provider says it is okay to do so.  If possible, raise (elevate) the injured area above the level of your heart while you are sitting or lying down. This will reduce pain and  swelling.  Keep all follow-up visits as directed by your health care provider. This is important. Contact a health care provider if:  You received a tetanus shot, and you have swelling, severe pain, redness, or bleeding at the injection site.  Your pain is not controlled with medicine.  You have redness, swelling, or more pain at the site of your wound. Get help right away if:  You have a red streak spreading away from your wound.  You have a fever.  You have fluid, blood, or pus coming from your wound.  You notice a bad smell coming from your wound or your dressing. Summary  An abrasion is a cut or a scrape on the outer surface of the skin. An abrasion does not go through all the layers of the skin.  Care for your abrasion properly to prevent infection.  Clean the wound with mild soap and water 2-3 times a day. Follow instructions from your health care provider about taking medicines and changing your bandage (dressing).  Contact your health care provider if you have redness, swelling or more pain in the wound area.  Get help right away if you have a fever or if you have fluid, blood, pus, a bad smell, or a red streak coming from the wound. This information is not intended to replace advice given to you by your health care provider. Make sure you discuss any questions you have with your health care provider. Document Revised: 10/14/2017 Document Reviewed: 06/15/2017 Elsevier Patient Education  2020 [employer] Lymphadenopathy  Lymphadenopathy means that your lymph glands are swollen or larger than normal (enlarged). Lymph glands, also called lymph nodes, are collections of tissue that filter bacteria, viruses, and waste from your bloodstream. They are part of your body's disease-fighting system (immune system), which protects your body from germs. There may be different causes of lymphadenopathy, depending on where it is in your body. Some types go away on their own.  Lymphadenopathy can occur anywhere that you have lymph glands, including these areas:  Neck (cervical lymphadenopathy).  Chest (mediastinal lymphadenopathy).  Lungs (hilar lymphadenopathy).  Underarms (axillary lymphadenopathy).  Groin (inguinal lymphadenopathy). When your immune system responds to germs, infection-fighting cells and fluid build up in your lymph glands. This causes some swelling and enlargement. If the lymph glands do not go back to normal after you have an infection or disease, your health care provider may do tests. These tests help to monitor your condition and  find the reason why the glands are still swollen and enlarged. Follow these instructions at home:  Get plenty of rest.  Take over-the-counter and prescription medicines only as told by your health care provider. Your health care provider may recommend over-the-counter medicines for pain.  If directed, apply heat to swollen lymph glands as often as told by your health care provider. Use the heat source that your health care provider recommends, such as a moist heat pack or a heating pad. ? Place a towel between your skin and the heat source. ? Leave the heat on for 20-30 minutes. ? Remove the heat if your skin turns bright red. This is especially important if you are unable to feel pain, heat, or cold. You may have a greater risk of getting burned.  Check your affected lymph glands every day for changes. Check other lymph gland areas as told by your health care provider. Check for changes such as: ? More swelling. ? Sudden increase in size. ? Redness or pain. ? Hardness.  Keep all follow-up visits as told by your health care provider. This is important. Contact a health care provider if you have:  Swelling that gets worse or spreads to other areas.  Problems with breathing.  Lymph glands that: ? Are still swollen after 2 weeks. ? Have suddenly gotten bigger. ? Are red, painful, or hard.  A fever or  chills.  Fatigue.  A sore throat.  Pain in your abdomen.  Weight loss.  Night sweats. Get help right away if you have:  Fluid leaking from an enlarged lymph gland.  Severe pain.  Chest pain.  Shortness of breath. Summary  Lymphadenopathy means that your lymph glands are swollen or larger than normal (enlarged).  Lymph glands (also called lymph nodes) are collections of tissue that filter bacteria, viruses, and waste from the bloodstream. They are part of your body's disease-fighting system (immune system).  Lymphadenopathy can occur anywhere that you have lymph glands.  If your enlarged and swollen lymph glands do not go back to normal after you have an infection or disease, your health care provider may do tests to monitor your condition and find the reason why the glands are still swollen and enlarged.  Check your affected lymph glands every day for changes. Check other lymph gland areas as told by your health care provider. This information is not intended to replace advice given to you by your health care provider. Make sure you discuss any questions you have with your health care provider. Document Revised: 10/14/2017 Document Reviewed: 09/16/2017 Elsevier Patient Education  2020 Else[employer] Inc. Cellulitis, Adult  Cellulitis is a skin infection. The infected area is usually warm, red, swollen, and tender. This condition occurs most often in the arms and lower legs. The infection can travel to the muscles, blood, and underlying tissue and become serious. It is very important to get treated for this condition. What are the causes? Cellulitis is caused by bacteria. The bacteria enter through a break in the skin, such as a cut, burn, insect bite, open sore, or crack. What increases the risk? This condition is more likely to occur in people who:  Have a weak body defense system (immune system).  Have open wounds on the skin, such as cuts, burns, bites, and scrapes. Bacteria  can enter the body through these open wounds.  Are older than 58 years of age.  Have diabetes.  Have a type of long-lasting (chronic) liver disease (cirrhosis) or kidney disease.  Are obese.  Have a skin condition such as: ? Itchy rash (eczema). ? Slow movement of blood in the veins (venous stasis). ? Fluid buildup below the skin (edema).  Have had radiation therapy.  Use IV drugs. What are the signs or symptoms? Symptoms of this condition include:  Redness, streaking, or spotting on the skin.  Swollen area of the skin.  Tenderness or pain when an area of the skin is touched.  Warm skin.  A fever.  Chills.  Blisters. How is this diagnosed? This condition is diagnosed based on a medical history and physical exam. You may also have tests, including:  Blood tests.  Imaging tests. How is this treated? Treatment for this condition may include:  Medicines, such as antibiotic medicines or medicines to treat allergies (antihistamines).  Supportive care, such as rest and application of cold or warm cloths (compresses) to the skin.  Hospital care, if the condition is severe. The infection usually starts to get better within 1-2 days of treatment. Follow these instructions at home:  Medicines  Take over-the-counter and prescription medicines only as told by your health care provider.  If you were prescribed an antibiotic medicine, take it as told by your health care provider. Do not stop taking the antibiotic even if you start to feel better. General instructions  Drink enough fluid to keep your urine pale yellow.  Do not touch or rub the infected area.  Raise (elevate) the infected area above the level of your heart while you are sitting or lying down.  Apply warm or cold compresses to the affected area as told by your health care provider.  Keep all follow-up visits as told by your health care provider. This is important. These visits let your health care  provider make sure a more serious infection is not developing. Contact a health care provider if:  You have a fever.  Your symptoms do not begin to improve within 1-2 days of starting treatment.  Your bone or joint underneath the infected area becomes painful after the skin has healed.  Your infection returns in the same area or another area.  You notice a swollen bump in the infected area.  You develop new symptoms.  You have a general ill feeling (malaise) with muscle aches and pains. Get help right away if:  Your symptoms get worse.  You feel very sleepy.  You develop vomiting or diarrhea that persists.  You notice red streaks coming from the infected area.  Your red area gets larger or turns dark in color. These symptoms may represent a serious problem that is an emergency. Do not wait to see if the symptoms will go away. Get medical help right away. Call your local emergency services (911 in the [place] Do not drive yourself to the hospital. Summary  Cellulitis is a skin infection. This condition occurs most often in the arms and lower legs.  Treatment for this condition may include medicines, such as antibiotic medicines or antihistamines.  Take over-the-counter and prescription medicines only as told by your health care provider. If you were prescribed an antibiotic medicine, do not stop taking the antibiotic even if you start to feel better.  Contact a health care provider if your symptoms do not begin to improve within 1-2 days of starting treatment or your symptoms get worse.  Keep all follow-up visits as told by your health care provider. This is important. These visits let your health care provider make sure that a more serious infection  is not developing. This information is not intended to replace advice given to you by your health care provider. Make sure you discuss any questions you have with your health care provider. Document Revised: 03/23/2018 Document  Reviewed: 03/23/2018 Elsevier Patient Education  2020 [employer].

## 2020-05-05 DIAGNOSIS — E6609 Other obesity due to excess calories: Secondary | ICD-10-CM | POA: Diagnosis not present

## 2020-05-05 DIAGNOSIS — I10 Essential (primary) hypertension: Secondary | ICD-10-CM | POA: Diagnosis not present

## 2020-05-05 DIAGNOSIS — E1169 Type 2 diabetes mellitus with other specified complication: Secondary | ICD-10-CM | POA: Diagnosis not present

## 2020-05-05 DIAGNOSIS — E782 Mixed hyperlipidemia: Secondary | ICD-10-CM | POA: Diagnosis not present

## 2020-05-12 DIAGNOSIS — H5340 Unspecified visual field defects: Secondary | ICD-10-CM | POA: Diagnosis not present

## 2020-05-12 DIAGNOSIS — E119 Type 2 diabetes mellitus without complications: Secondary | ICD-10-CM | POA: Diagnosis not present

## 2020-07-06 ENCOUNTER — Ambulatory Visit
Admission: EM | Admit: 2020-07-06 | Discharge: 2020-07-06 | Disposition: A | Discharge status: Home / Self Care | Payer: Federal, State, Local not specified - PPO | Attending: Physician Assistant | Admitting: Physician Assistant

## 2020-07-06 ENCOUNTER — Other Ambulatory Visit: Payer: Self-pay

## 2020-07-06 DIAGNOSIS — Z20822 Contact with and (suspected) exposure to covid-19: Secondary | ICD-10-CM

## 2020-07-06 NOTE — Discharge Instructions (Signed)

## 2020-07-06 NOTE — ED Triage Notes (Signed)
Patient presents for COVID Test for positive exposure to live in family member.

## 2020-07-07 LAB — NOVEL CORONAVIRUS, NAA: SARS-CoV-2, NAA: NOT DETECTED

## 2020-07-07 LAB — SARS-COV-2, NAA 2 DAY TAT

## 2020-08-20 DIAGNOSIS — Z01419 Encounter for gynecological examination (general) (routine) without abnormal findings: Secondary | ICD-10-CM | POA: Diagnosis not present

## 2020-08-20 DIAGNOSIS — Z1231 Encounter for screening mammogram for malignant neoplasm of breast: Secondary | ICD-10-CM | POA: Diagnosis not present

## 2020-08-20 DIAGNOSIS — Z1272 Encounter for screening for malignant neoplasm of vagina: Secondary | ICD-10-CM | POA: Diagnosis not present

## 2020-11-19 ENCOUNTER — Ambulatory Visit
Admission: EM | Admit: 2020-11-19 | Discharge: 2020-11-19 | Disposition: A | Discharge status: Home / Self Care | Payer: Federal, State, Local not specified - PPO | Attending: Internal Medicine | Admitting: Internal Medicine

## 2020-11-19 ENCOUNTER — Encounter: Payer: Self-pay | Admitting: Registered Nurse

## 2020-11-19 ENCOUNTER — Telehealth: Payer: Self-pay | Admitting: Registered Nurse

## 2020-11-19 ENCOUNTER — Other Ambulatory Visit: Payer: Self-pay

## 2020-11-19 DIAGNOSIS — R509 Fever, unspecified: Secondary | ICD-10-CM

## 2020-11-19 DIAGNOSIS — Z1152 Encounter for screening for COVID-19: Secondary | ICD-10-CM

## 2020-11-19 DIAGNOSIS — U071 COVID-19: Secondary | ICD-10-CM

## 2020-11-19 HISTORY — DX: Type 2 diabetes mellitus without complications: E11.9

## 2020-11-19 HISTORY — DX: Essential (primary) hypertension: I10

## 2020-11-19 NOTE — ED Provider Notes (Addendum)
[facility]    CSN: [id number] Arrival date & time: 11/19/20  1253      History   Chief Complaint Chief Complaint  Patient presents with  . Generalized Body Aches  . Fever  . Chills  . Nasal Congestion    HPI Regina Rodriguez is a 60 y.o. female.   Pt developed chills Friday.  She had a fever of 102 on Friday.  Fever eventually improved over the weekend.  She has been drinking juice, taking mucinex and taking tylenol.  Other symptoms include cough, headache, and body aches.  Right ear felt like it was 'thumping'.  She was [facility] due to symtpoms (feeling weak).  Denies n/v/d.  She has had two covid vaccine, no booster.  She has had the flu shot.  No known covid positive exposures but some people at work have been out for covid.       Past Medical History:  Diagnosis Date  . Diabetes mellitus without complication (HCC)   . Hypertension     There are no problems to display for this patient.   Past Surgical History:  Procedure Laterality Date  . ABDOMINAL HYSTERECTOMY    . FINGER SURGERY    . KNEE SURGERY Left   . KNEE SURGERY Right     OB History   No obstetric history on file.      Home Medications    Prior to Admission medications   Medication Sig Start Date End Date Taking? Authorizing Provider  aspirin EC 81 MG tablet Take 81 mg by mouth daily.    [provider]  empagliflozin (JARDIANCE) 25 MG TABS tablet Take 25 mg by mouth daily.    [provider]  HYDROcodone-acetaminophen (NORCO) 5-325 MG tablet Take 1 tablet by mouth every 6 (six) hours as needed for moderate pain. 05/24/19   Park Liter, DPM  NONFORMULARY OR COMPOUNDED ITEM [facility]:  Scar Cream - Verapamil 10%, Pentoxifylline 5%. Apply 1-2 grams to affected areas 3-4 times daily. 02/15/19   Park Liter, DPM  OZEMPIC, 1 MG/DOSE, 2 MG/1.5ML [facility]  11/21/18   [provider]  rosuvastatin (CRESTOR) 40 MG tablet  Take 40 mg by mouth daily. 07/11/19   [provider]  valsartan-hydrochlorothiazide (DIOVAN-HCT) 80-12.5 MG per tablet Take 1 tablet by mouth daily.    [provider]    Family History Family History  Problem Relation Age of Onset  . Heart disease Father     Social History Social History   Tobacco Use  . Smoking status: Never Smoker  . Smokeless tobacco: Never Used  Substance Use Topics  . Alcohol use: No  . Drug use: No     Allergies   Patient has no known allergies.   Review of Systems Review of Systems  All other systems reviewed and are negative.    Physical Exam Triage Vital Signs ED Triage Vitals  Enc Vitals Group     BP 11/19/20 1524 110/68     Pulse Rate 11/19/20 1524 74     Resp 11/19/20 1524 16     Temp 11/19/20 1524 97.8 F (36.6 C)     Temp Source 11/19/20 1524 Oral     SpO2 11/19/20 1524 97 %     Weight 11/19/20 1521 185 lb (83.9 kg)     Height --      Head Circumference --      Peak Flow --  Pain Score 11/19/20 1521 0     Pain Loc --      Pain Edu? --      Excl. in GC? --    No data found.  Updated Vital Signs BP 110/68 (BP Location: Right Arm)   Pulse 74   Temp 97.8 F (36.6 C) (Oral)   Resp 16   Wt 83.9 kg   SpO2 97%   BMI 28.98 kg/m   Visual Acuity Right Eye Distance:   Left Eye Distance:   Bilateral Distance:    Right Eye Near:   Left Eye Near:    Bilateral Near:     Physical Exam Constitutional:      General: She is not in acute distress.    Appearance: She is normal weight.  HENT:     Head: Normocephalic.     Nose: Nose normal. No congestion.     Mouth/Throat:     Mouth: Mucous membranes are moist.     Pharynx: Oropharynx is clear.  Eyes:     Conjunctiva/sclera: Conjunctivae normal.     Pupils: Pupils are equal, round, and reactive to light.  Cardiovascular:     Rate and Rhythm: Normal rate and regular rhythm.     Pulses: Normal pulses.     Heart sounds: No murmur  heard.   Pulmonary:     Effort: Pulmonary effort is normal. No respiratory distress.     Breath sounds: Normal breath sounds. No wheezing.  Abdominal:     General: Abdomen is flat. Bowel sounds are normal.     Palpations: Abdomen is soft.     Tenderness: There is no abdominal tenderness.  Skin:    General: Skin is warm.  Neurological:     Mental Status: She is alert.  Psychiatric:        Mood and Affect: Mood normal.        Behavior: Behavior normal.      UC Treatments / Results  Labs (all labs ordered are listed, but only abnormal results are displayed) Labs Reviewed  NOVEL CORONAVIRUS, NAA    EKG   Radiology No results found.  Procedures Procedures (including critical care time)  Medications Ordered in UC Medications - No data to display  Initial Impression / Assessment and Plan / UC Course  I have reviewed the triage vital signs and the nursing notes.  Pertinent labs & imaging results that were available during my care of the patient were reviewed by me and considered in my medical decision making (see chart for details).      Pt presents with approximately 5 days of improving fever/chills, cough and body ache.  Likely viral infection with either covid, flu, or other virus.  Pt appears well today.  Will get covid test and call pt with results.   Final Clinical Impressions(s) / UC Diagnoses   Final diagnoses:  Encounter for screening for COVID-19     Discharge Instructions     We will call you with the results of the covid test when we get them.  This may take 24-48 hours or more depending on how busy the lab is.      ED Prescriptions    None     PDMP not reviewed this encounter.   Sandre Kitty, MD 11/19/20 1629    Sandre Kitty, MD 11/19/20 [phone]

## 2020-11-19 NOTE — Telephone Encounter (Signed)
HR manager Salley Slaughter notified NP that pt sent home by supervisor today for having nasal stuffiness and fatigue and to please contact patient.   Reviewed Epic patient seen at [facility] today and covid testing performed results pending.  Patient contacted via telephone stated symptoms started 12/31 evening.  Worsed into fever, cough, headache, body aches on her days off resolved.  Went to gym Wednesday had chills.  Received flu shot this year but no covid booster yet #2 covid vaccine 02/14/2020 and she thinks was moderna.  Mild symptoms after #1 sore arm, #2 fatigue and slept longer than usual but woke up fine.  Her ear has been throbbing again she figured was post nasal drip again and feeling a little off balance also.  Patient was told her test results would be back in 24-48 hours.  She can get in to her mychart account.  Discussed quarantine at home until test results known.  If negative may return to work with strict mask use through 24 Nov 2020 and no eating in lunch room.  Discussed other viruses circulating in community and she was not tested for flu today.  Discussed overdue to schedule moderna booster and recommended she schedule once feeling better and this test negative.  If positive can get booster in 60-90 days as natural immunity shown approx 10 weeks for current variant omicron based on [place] and [place] studies.  Patient A&Ox3 respirations even and unlabored no cough audible spoke full sentences without difficulty.  Call duration 15 minutes.  Patient to isolate in own room and if possible use only one bathroom if living with others in home.  Wear mask when out of room to help prevent spread to others in household.   Patient to bring her covid vaccine card to clinic for transcription into epic next week.  Pt verbalized understanding and agreement with plan of care. No further questions/concerns at this time. Pt reminded to contact clinic with any changes in sx or questions/concerns and test  results.  Symptoms improving but no booster  HR notified patient to quarantine until test results available and mask through 24 Nov 2020.

## 2020-11-19 NOTE — ED Triage Notes (Signed)
Patient presents to Urgent Care with complaints of cough, body aches, chills, right ear pressure, fatigue x 6 days.  Treating symptoms with OTC cold meds.

## 2020-11-19 NOTE — Discharge Instructions (Addendum)
We will call you with the results of the covid test when we get them.  This may take 24-48 hours or more depending on how busy the lab is.

## 2020-11-21 NOTE — Telephone Encounter (Signed)
Attempted to contact pt by phone. No answer. LVMRCB and gave email address for contact since clinic closing for the day shortly.

## 2020-11-21 NOTE — Telephone Encounter (Signed)
Reviewed RN Rolly Salter note test results still pending patient taking mucinex will follow up with patient this weekend.

## 2020-11-21 NOTE — Telephone Encounter (Signed)
Pt returned call. She reports taking Mucinex for chest congestion and drainage. This is improving. Taking Tylenol prn. Otalgia resolved. Fatigue, weakness, and body aches have all resolved. Awaiting test results but have completed 5 day quarantine and with sx improving, can RTW onsite Monday 1/10 with strict mask use thru 1/10.

## 2020-11-22 LAB — NOVEL CORONAVIRUS, NAA: SARS-CoV-2, NAA: DETECTED — AB

## 2020-11-23 NOTE — Telephone Encounter (Signed)
Noted positive Covid test result in [facility]. Spoke with pt by phone. She reports receiving mesg from UC about positive result. Her sx have all resolved. She is planning to RTW tomorrow 1/10 with strict mask use thru 1/10.  Advised no booster for 90 days as she was planning on obtaining this next week thru on-site clinic. She plans to bring vax card to clinic tomorrow so RN can enter vax dates in [place]. She denies further questions/concerns.

## 2020-11-24 NOTE — Telephone Encounter (Signed)
Reviewed RN Haley note agree with plan of care. 

## 2020-11-29 NOTE — Telephone Encounter (Signed)
Telephone message left for patient checking in to see how she is feeling and how work went this week may return call to [phone].

## 2020-11-30 NOTE — Telephone Encounter (Signed)
Patient returned call and stated doing well no questions or concerns and work went well this week.  She has covid vaccination card and can bring to clinic next week for input into Epic if RN Rolly Salter cannot find information.  Patient spoke full sentences without difficulty no nasal congestion/throat clearing or cough.  Patient verbalized understanding information/instructions, agreed with plan of care and had no further questions at this time.

## 2021-01-08 DIAGNOSIS — E1169 Type 2 diabetes mellitus with other specified complication: Secondary | ICD-10-CM | POA: Diagnosis not present

## 2021-01-08 DIAGNOSIS — I10 Essential (primary) hypertension: Secondary | ICD-10-CM | POA: Diagnosis not present

## 2021-01-08 DIAGNOSIS — E6609 Other obesity due to excess calories: Secondary | ICD-10-CM | POA: Diagnosis not present

## 2021-01-08 DIAGNOSIS — E782 Mixed hyperlipidemia: Secondary | ICD-10-CM | POA: Diagnosis not present

## 2021-04-17 DIAGNOSIS — E782 Mixed hyperlipidemia: Secondary | ICD-10-CM | POA: Diagnosis not present

## 2021-04-17 DIAGNOSIS — I10 Essential (primary) hypertension: Secondary | ICD-10-CM | POA: Diagnosis not present

## 2021-04-17 DIAGNOSIS — E1169 Type 2 diabetes mellitus with other specified complication: Secondary | ICD-10-CM | POA: Diagnosis not present

## 2021-04-18 DIAGNOSIS — E6609 Other obesity due to excess calories: Secondary | ICD-10-CM | POA: Diagnosis not present

## 2021-04-18 DIAGNOSIS — E782 Mixed hyperlipidemia: Secondary | ICD-10-CM | POA: Diagnosis not present

## 2021-04-18 DIAGNOSIS — E1169 Type 2 diabetes mellitus with other specified complication: Secondary | ICD-10-CM | POA: Diagnosis not present

## 2021-04-18 DIAGNOSIS — I10 Essential (primary) hypertension: Secondary | ICD-10-CM | POA: Diagnosis not present

## 2021-08-13 DIAGNOSIS — I10 Essential (primary) hypertension: Secondary | ICD-10-CM | POA: Diagnosis not present

## 2021-08-13 DIAGNOSIS — E6609 Other obesity due to excess calories: Secondary | ICD-10-CM | POA: Diagnosis not present

## 2021-08-13 DIAGNOSIS — E1169 Type 2 diabetes mellitus with other specified complication: Secondary | ICD-10-CM | POA: Diagnosis not present

## 2021-08-13 DIAGNOSIS — E782 Mixed hyperlipidemia: Secondary | ICD-10-CM | POA: Diagnosis not present

## 2021-08-14 DIAGNOSIS — I10 Essential (primary) hypertension: Secondary | ICD-10-CM | POA: Diagnosis not present

## 2021-08-14 DIAGNOSIS — F064 Anxiety disorder due to known physiological condition: Secondary | ICD-10-CM | POA: Diagnosis not present

## 2021-08-14 DIAGNOSIS — E1169 Type 2 diabetes mellitus with other specified complication: Secondary | ICD-10-CM | POA: Diagnosis not present

## 2021-09-21 DIAGNOSIS — M5432 Sciatica, left side: Secondary | ICD-10-CM | POA: Diagnosis not present

## 2021-10-13 DIAGNOSIS — Z01419 Encounter for gynecological examination (general) (routine) without abnormal findings: Secondary | ICD-10-CM | POA: Diagnosis not present

## 2021-10-13 DIAGNOSIS — Z1231 Encounter for screening mammogram for malignant neoplasm of breast: Secondary | ICD-10-CM | POA: Diagnosis not present

## 2022-08-24 ENCOUNTER — Ambulatory Visit: Payer: Self-pay | Admitting: Registered Nurse

## 2022-08-24 ENCOUNTER — Encounter: Payer: Self-pay | Admitting: Registered Nurse

## 2022-08-24 DIAGNOSIS — Z23 Encounter for immunization: Secondary | ICD-10-CM

## 2022-08-24 NOTE — Progress Notes (Signed)
0.21ml IM right deltoid fluarix quadrivalent administered to patient no bleeding/adverse side effects noted in clinic.  Patient completed consent form see paper chart all answers no patient denied previous adverse reactions to flu or on suppressant medications.  Manufacturer [place] Lot [phone] Expiration 05/15/2023 VIS Jun 20 2020  Epic and [place] updated for immunization administration.

## 2023-10-31 ENCOUNTER — Other Ambulatory Visit: Payer: Self-pay | Admitting: Obstetrics and Gynecology

## 2023-10-31 DIAGNOSIS — R928 Other abnormal and inconclusive findings on diagnostic imaging of breast: Secondary | ICD-10-CM

## 2023-11-23 ENCOUNTER — Ambulatory Visit
Admission: RE | Admit: 2023-11-23 | Discharge: 2023-11-23 | Disposition: A | Discharge status: Home / Self Care | Payer: Federal, State, Local not specified - PPO | Source: Ambulatory Visit | Attending: Obstetrics and Gynecology | Admitting: Obstetrics and Gynecology

## 2023-11-23 ENCOUNTER — Ambulatory Visit: Payer: Federal, State, Local not specified - PPO

## 2023-11-23 DIAGNOSIS — R928 Other abnormal and inconclusive findings on diagnostic imaging of breast: Secondary | ICD-10-CM

## 2023-12-06 ENCOUNTER — Encounter: Payer: Self-pay | Admitting: Registered Nurse

## 2023-12-06 ENCOUNTER — Ambulatory Visit: Payer: Self-pay | Admitting: Registered Nurse

## 2023-12-06 VITALS — BP 125/82 | HR 75 | Temp 98.2°F | Resp 16

## 2023-12-06 DIAGNOSIS — Z800 Family history of malignant neoplasm of digestive organs: Secondary | ICD-10-CM | POA: Insufficient documentation

## 2023-12-06 DIAGNOSIS — Z1211 Encounter for screening for malignant neoplasm of colon: Secondary | ICD-10-CM | POA: Insufficient documentation

## 2023-12-06 DIAGNOSIS — K648 Other hemorrhoids: Secondary | ICD-10-CM | POA: Insufficient documentation

## 2023-12-06 DIAGNOSIS — N644 Mastodynia: Secondary | ICD-10-CM | POA: Insufficient documentation

## 2023-12-06 DIAGNOSIS — K5904 Chronic idiopathic constipation: Secondary | ICD-10-CM | POA: Insufficient documentation

## 2023-12-06 DIAGNOSIS — R202 Paresthesia of skin: Secondary | ICD-10-CM

## 2023-12-06 DIAGNOSIS — K626 Ulcer of anus and rectum: Secondary | ICD-10-CM | POA: Insufficient documentation

## 2023-12-06 DIAGNOSIS — K6289 Other specified diseases of anus and rectum: Secondary | ICD-10-CM | POA: Insufficient documentation

## 2023-12-06 DIAGNOSIS — D229 Melanocytic nevi, unspecified: Secondary | ICD-10-CM | POA: Insufficient documentation

## 2023-12-06 NOTE — Progress Notes (Signed)
Subjective:    Patient ID: Regina Rodriguez, female    DOB: Jun 15, 1961, 63 y.o.   MRN: [id number]  62y/o african [place] female established patient here for evaluation tingling numbness left fingers for a couple weeks.  Saw PCM recently no labs drawn was instructed to start vitamin.  Denied trauma/injury/rash/swelling/bruising.  Works in [employer]      Review of Systems  Constitutional:  Negative for chills and fever.  HENT:  Negative for trouble swallowing and voice change.   Musculoskeletal:  Negative for arthralgias, gait problem, joint swelling, myalgias, neck pain and neck stiffness.  Skin:  Negative for color change, pallor, rash and wound.  Neurological:  Positive for numbness. Negative for dizziness, tremors, seizures, syncope, facial asymmetry, speech difficulty, weakness, light-headedness and headaches.  Hematological:  Negative for adenopathy. Does not bruise/bleed easily.  Psychiatric/Behavioral:  Negative for agitation, confusion and sleep disturbance.       Objective:   Physical Exam Vitals reviewed.  Constitutional:      General: She is awake. She is not in acute distress.    Appearance: Normal appearance. She is well-developed and well-groomed. She is not ill-appearing, toxic-appearing or diaphoretic.  HENT:     Head: Normocephalic and atraumatic.     Jaw: There is normal jaw occlusion.     Salivary Glands: Right salivary gland is not diffusely enlarged. Left salivary gland is not diffusely enlarged.     Right Ear: Hearing and external ear normal.     Left Ear: Hearing and external ear normal.     Nose: Nose normal. No congestion or rhinorrhea.     Mouth/Throat:     Lips: Pink. No lesions.     Mouth: Mucous membranes are moist.     Pharynx: Oropharynx is clear.  Eyes:     General: Lids are normal. Vision grossly intact. Gaze aligned appropriately. No scleral icterus.       Right eye: No discharge.        Left eye: No discharge.     Extraocular  Movements: Extraocular movements intact.     Conjunctiva/sclera: Conjunctivae normal.     Pupils: Pupils are equal, round, and reactive to light.  Neck:     Trachea: Trachea normal.  Cardiovascular:     Rate and Rhythm: Normal rate and regular rhythm.     Pulses: Normal pulses.          Radial pulses are 2+ on the right side and 2+ on the left side.  Pulmonary:     Effort: Pulmonary effort is normal. No respiratory distress.     Breath sounds: Normal breath sounds and air entry. No stridor or transmitted upper airway sounds. No wheezing.     Comments: Spoke full sentences without difficulty; no cough observed in exam room Abdominal:     General: Abdomen is flat.  Musculoskeletal:        General: No swelling, tenderness, deformity or signs of injury. Normal range of motion.     Right elbow: No swelling, deformity, effusion or lacerations. Normal range of motion. No tenderness.     Left elbow: No swelling, deformity, effusion or lacerations. Normal range of motion. No tenderness.     Right forearm: No swelling, edema, deformity, lacerations, tenderness or bony tenderness.     Left forearm: No swelling, edema, deformity, lacerations, tenderness or bony tenderness.     Right wrist: No swelling, deformity, effusion, lacerations, tenderness, bony tenderness, snuff box tenderness or crepitus. Normal range of motion.  Left wrist: No swelling, deformity, effusion, lacerations, tenderness, bony tenderness, snuff box tenderness or crepitus. Normal range of motion.     Right hand: No swelling, deformity, lacerations, tenderness or bony tenderness. Normal range of motion. Normal strength. Normal sensation. Normal capillary refill.     Left hand: No swelling, deformity, lacerations, tenderness or bony tenderness. Normal range of motion. Normal strength. Normal sensation. Normal capillary refill.     Cervical back: Normal range of motion and neck supple. No swelling, edema, deformity, erythema, signs of  trauma, lacerations, rigidity or crepitus. No pain with movement. Normal range of motion.     Right lower leg: No edema.     Left lower leg: No edema.  Lymphadenopathy:     Head:     Right side of head: No submandibular or preauricular adenopathy.     Left side of head: No submandibular or preauricular adenopathy.     Cervical: No cervical adenopathy.     Right cervical: No superficial cervical adenopathy.    Left cervical: No superficial cervical adenopathy.  Skin:    General: Skin is warm and dry.     Capillary Refill: Capillary refill takes less than 2 seconds.     Coloration: Skin is not ashen, cyanotic, jaundiced, mottled, pale or sallow.     Findings: No abrasion, abscess, acne, bruising, burn, ecchymosis, erythema, signs of injury, laceration, lesion, petechiae, rash or wound.     Nails: There is no clubbing.     Comments: Face/neck/arms/hands visually inspected  Neurological:     General: No focal deficit present.     Mental Status: She is alert and oriented to person, place, and time. Mental status is at baseline.     GCS: GCS eye subscore is 4. GCS verbal subscore is 5. GCS motor subscore is 6.     Cranial Nerves: No cranial nerve deficit, dysarthria or facial asymmetry.     Sensory: Sensation is intact. No sensory deficit.     Motor: Motor function is intact. No weakness, tremor, atrophy, abnormal muscle tone or seizure activity.     Coordination: Coordination is intact. Coordination normal.     Gait: Gait is intact. Gait normal.     Comments: In/out of chair without difficulty; gait sure and steady in clinic; bilateral hand grasp equal 5/5  Psychiatric:        Attention and Perception: Attention and perception normal.        Mood and Affect: Mood and affect normal.        Speech: Speech normal.        Behavior: Behavior normal. Behavior is cooperative.        Thought Content: Thought content normal.        Cognition and Memory: Cognition and memory normal.         Judgment: Judgment normal.     + phalens test left only; fitted left medium wrist splint dispensed to patient from clinic stock; negative finkelsteins bilaterally and negative tinel and phalens tests right hand/wrist Patient has not had any labs in the past year per epic review care everywhere.  Discussed sometimes paresthesias can occur with pinched nerve, vitamin deficiencies B6/B12,D or mineral deficiencies     Assessment & Plan:   A-left finger paresthesias  P-Fitted and distributed wrist splint left medium from clinic stock  Discussed may hand wash and air dry or use blowdryer but do not place in drying machine for clothes as can melt plastic.  biofreeze gel apply QID prn  pain has mobic at home for prn use with food x 2 weeks trial.  Lift fewer items at one time if having tingling/numbness with lifting product at work due to weight of items.  Discussed mild phalen's positive left only wear splint at night  Patient was instructed to rest, ice and elevate arms if swelling noted after work.  Cryotherapy 15 minutes TID prn pain/swelling. [facility] handout on carpal tunnel, paresthesias and rehab exercises and carpal tunnel printed and given to patient.  No other nsaids while taking mobic/meloxicam e.g. aleve/ibuprofen/advil/naproxen/naprosyn/motrin/voltaren/diclofenac.  May use tylenol 1000mg  oral every 6 hours in addition to meloxicam.  Re-evaluation in 2 weeks. Call or return to clinic as needed if these symptoms worsen  or fail to improve as anticipated and will consider PT and orthopedics evaluation.  Next follow up evaluation to be scheduled with RN Rolly Salter in 2 weeks.  Avoid starting new hobbies/activity that utilize hands in the next two weeks and then only 15 minutes per day after that time of rest.  Patient verbalized agreement and understanding of treatment plan and had no further questions at this time. P2: ROM, injury prevention

## 2023-12-06 NOTE — Patient Instructions (Signed)
Carpal Tunnel Rehabilitation, Adult This videos shows exercises that can help carpal tunnel syndrome. To view the content, go to this web address: [email or web address]  This video will expire on: 10/26/2025. If you need access to this video following this date, please reach out to the healthcare provider who assigned it to you. This information is not intended to replace advice given to you by your health care provider. Make sure you discuss any questions you have with your health care provider. Elsevier Patient Education  2024 Elsevier Inc.Paresthesia Paresthesia is an abnormal burning or prickling sensation. It is usually felt in the hands, arms, legs, or feet. However, it may occur in any part of the body. Usually, paresthesia is not painful. It may feel like: Tingling or numbness. Buzzing. Itching. Paresthesia may occur without any clear cause, or it may be caused by: Breathing too quickly (hyperventilation). Pressure on a nerve. An underlying medical condition. Side effects of a medicine. Nutritional deficiencies. Exposure to toxic chemicals. Most people experience temporary (transient) paresthesia at some time in their lives. For some people, it may be long-lasting (chronic) because of an underlying medical condition. If you have paresthesia that lasts a long time, you need to be evaluated by your health care provider. Follow these instructions at home: Nutrition Eat a healthy diet. This includes: Eating foods that are high in fiber, such as beans, whole grains, and fresh fruits and vegetables. Limiting foods that are high in fat and processed sugars, such as fried or sweet foods.  Alcohol use  Avoid or limit alcohol. Too much alcohol can cause a vitamin B deficiency, and vitamin B is needed for healthy nerves. Do not drink alcohol if: Your health care provider tells you not to drink. You are pregnant, may be pregnant, or are planning to become pregnant. If you  drink alcohol: Limit how much you have to: 0-1 drink a day for women. 0-2 drinks a day for men. Know how much alcohol is in your drink. In the [place] one drink equals one 12 oz bottle of beer (355 mL), one 5 oz glass of wine (148 mL), or one 1 oz glass of hard liquor (44 mL). General instructions Take over-the-counter and prescription medicines only as told by your health care provider. Do not use any products that contain nicotine or tobacco. These products include cigarettes, chewing tobacco, and vaping devices, such as e-cigarettes. If you need help quitting, ask your health care provider. If you have diabetes, work closely with your health care provider to keep your blood sugar under control. If you have numbness in your feet: Check every day for signs of injury or infection. Watch for redness, warmth, and swelling. Wear padded socks and comfortable shoes. These help protect your feet. Keep all follow-up visits. This is important. Contact a health care provider if you: Have paresthesia that gets worse or does not go away. Have numbness after an injury. Have a burning or prickling feeling that gets worse when you walk. Have pain, cramps, or dizziness, or you faint. Develop a rash. Get help right away if you: Feel muscle weakness. Develop new weakness in an arm or leg. Have trouble walking or moving. Have problems with speech, understanding, or vision. Feel confused. Cannot control your bladder or bowel movements. These symptoms may be an emergency. Get help right away. Call 911. Do not wait to see if the symptoms will go away. Do not drive yourself to the hospital. Summary Paresthesia is an abnormal burning or prickling sensation that  is usually felt in the hands, arms, legs, or feet. It may also occur in other parts of the body. Paresthesia may occur without any clear cause, or it may be caused by breathing too quickly (hyperventilation), pressure on a nerve, an underlying medical  condition, side effects of a medicine, nutritional deficiencies, or exposure to toxic chemicals. If you have paresthesia that lasts a long time, you need to be evaluated by your health care provider. This information is not intended to replace advice given to you by your health care provider. Make sure you discuss any questions you have with your health care provider. Document Revised: 07/13/2021 Document Reviewed: 07/13/2021 Elsevier Patient Education  2024 Elsevier Inc.Pinched Nerve in the Wrist (Carpal Tunnel Syndrome): What to Know  Pinched nerve in the wrist (carpal tunnel syndrome, or CTS) is a nerve problem that causes pain, numbness, and weakness in the wrist, hand, and fingers. The carpal tunnel is a narrow space that is on the palm side of your wrist. Repeated wrist motions or certain diseases may cause swelling in the tunnel. This swelling can pinch the main nerve in the wrist (the median nerve). What are the causes? CTS may be caused by: Moving your hand and wrist over and over again while doing a task. Hurting the wrist. Arthritis. A pocket of fluid (cyst) or a growth (tumor) in the carpal tunnel. Fluid buildup when you are pregnant. Use of tools that vibrate. In some cases, the cause of CTS is not known. What increases the risk? You're more likely to have CTS if: You have a job that makes you do these things: Move your hand firmly over and over again. Work with tools that vibrate, such as drills or sanders. You're female. You have diabetes, obesity, thyroid problems, or kidney failure. What are the signs or symptoms? Symptoms of this condition include: A tingling feeling in your fingers. You may feel this pain in the thumb, index finger, or middle finger. Tingling or loss of feeling in your hand. Pain in your entire arm. This pain may get worse when you bend your wrist and elbow for a long time. Pain in your wrist that goes up your arm to your shoulder. Pain that goes  down into your palm or fingers. Weakness in your hands. You may find it hard to grab and hold items. Your symptoms may feel worse during the night. How is this diagnosed? CTS is diagnosed with a medical history and physical exam. Tests and imaging may also be done to: Check the electrical signals sent by your nerves into the muscles. Check how well electrical signals pass through your nerves. Check possible causes of your CTS. These include X-rays, ultrasound, and MRI. How is this treated? CTS may be treated with: Lifestyle changes. You will be asked to stop or change the activity that caused your problem. Physical therapy. This may include: Exercises that stretch and strengthen the muscles and tendons in the wrist and hand. Nerve gliding or flossing exercises. These help keep nerves moving smoothly through the tissues around them. Occupational therapy. You'll learn how to use your hand again. Medicines for pain and swelling. You may have injections in your wrist. A wrist splint or brace. Surgery. Follow these instructions at home: If you have a splint or brace: Wear the splint or brace as told. Take it off only if your provider says you can. Check the skin around it every day. Tell your provider if you see problems. Loosen the splint or brace  if your fingers tingle, are numb, or turn cold and blue. Keep the splint or brace clean and dry. If the splint or brace isn't waterproof: Do not let it get wet. Cover it when you take a bath or shower. Use a cover that doesn't let any water in. Managing pain, stiffness, and swelling  Use ice or an ice pack as told. If you have a splint or brace that you can take off, remove it only as told. Place a towel between your skin and the ice. Leave the ice on for 20 minutes, 2-3 times a day. If your skin turns red, take off the ice right away to prevent skin damage. The risk of damage is higher if you can't feel pain, heat, or cold. Move your fingers  often to reduce stiffness and swelling. General instructions Take your medicines only as told. Rest your wrist and hand from activity that may cause pain. If your CTS is caused by things you do at work, talk with your employer about making changes. For example, you may need a wrist pad to use while typing. Exercise as told. Follow instructions on how to do nerve gliding or flossing exercises. These help keep nerves in moving smoothly through the tissues around them. Keep all follow-up visits. This is important. Where to find more information American Academy of Orthopedic Surgeons: orthoinfo.aaos.[employer] of Neurological Disorders and Stroke: BasicFM.no Contact a health care provider if: You have new symptoms. Your pain is not controlled with medicines. Your symptoms get worse. Get help right away if: Your hand or wrist tingles or is numb, and the symptoms become very bad. This information is not intended to replace advice given to you by your health care provider. Make sure you discuss any questions you have with your health care provider. Document Revised: 09/13/2023 Document Reviewed: 07/01/2023 Elsevier Patient Education  2024 [employer].

## 2024-01-05 ENCOUNTER — Telehealth: Payer: Self-pay | Admitting: Registered Nurse

## 2024-01-05 ENCOUNTER — Encounter: Payer: Self-pay | Admitting: Registered Nurse

## 2024-01-05 DIAGNOSIS — R202 Paresthesia of skin: Secondary | ICD-10-CM
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
